# Patient Record
Sex: Female | Born: 1985 | Race: Black or African American | Hispanic: No | Marital: Single | Smoking: Never smoker
Health system: Southern US, Community
[De-identification: ages and names within clinical notes are randomized; demographics above are authoritative.]

## PROBLEM LIST (undated history)

## (undated) ENCOUNTER — Inpatient Hospital Stay (HOSPITAL_COMMUNITY): Payer: Self-pay

## (undated) DIAGNOSIS — O09299 Supervision of pregnancy with other poor reproductive or obstetric history, unspecified trimester: Secondary | ICD-10-CM

## (undated) DIAGNOSIS — Z8619 Personal history of other infectious and parasitic diseases: Secondary | ICD-10-CM

## (undated) DIAGNOSIS — I10 Essential (primary) hypertension: Secondary | ICD-10-CM

## (undated) HISTORY — PX: COLPOSCOPY: SHX161

## (undated) HISTORY — DX: Supervision of pregnancy with other poor reproductive or obstetric history, unspecified trimester: O09.299

## (undated) HISTORY — DX: Essential (primary) hypertension: I10

## (undated) HISTORY — PX: WISDOM TOOTH EXTRACTION: SHX21

## (undated) HISTORY — DX: Personal history of other infectious and parasitic diseases: Z86.19

---

## 2007-06-25 DIAGNOSIS — O1490 Unspecified pre-eclampsia, unspecified trimester: Secondary | ICD-10-CM

## 2007-09-17 ENCOUNTER — Inpatient Hospital Stay (HOSPITAL_COMMUNITY): Admission: AD | Admit: 2007-09-17 | Discharge: 2007-09-18 | Payer: Self-pay | Admitting: Obstetrics & Gynecology

## 2007-10-07 ENCOUNTER — Ambulatory Visit (HOSPITAL_COMMUNITY): Admission: RE | Admit: 2007-10-07 | Discharge: 2007-10-07 | Payer: Self-pay | Admitting: Family Medicine

## 2007-11-04 ENCOUNTER — Ambulatory Visit (HOSPITAL_COMMUNITY): Admission: RE | Admit: 2007-11-04 | Discharge: 2007-11-04 | Payer: Self-pay | Admitting: Obstetrics

## 2007-11-18 ENCOUNTER — Inpatient Hospital Stay (HOSPITAL_COMMUNITY): Admission: AD | Admit: 2007-11-18 | Discharge: 2007-11-18 | Payer: Self-pay | Admitting: Obstetrics

## 2007-12-25 ENCOUNTER — Inpatient Hospital Stay (HOSPITAL_COMMUNITY): Admission: AD | Admit: 2007-12-25 | Discharge: 2007-12-25 | Payer: Self-pay | Admitting: Obstetrics

## 2007-12-27 ENCOUNTER — Inpatient Hospital Stay (HOSPITAL_COMMUNITY): Admission: AD | Admit: 2007-12-27 | Discharge: 2007-12-27 | Payer: Self-pay | Admitting: Obstetrics

## 2008-03-10 ENCOUNTER — Inpatient Hospital Stay (HOSPITAL_COMMUNITY): Admission: AD | Admit: 2008-03-10 | Discharge: 2008-03-10 | Payer: Self-pay | Admitting: Obstetrics

## 2008-03-13 ENCOUNTER — Encounter (INDEPENDENT_AMBULATORY_CARE_PROVIDER_SITE_OTHER): Payer: Self-pay | Admitting: Obstetrics

## 2008-03-13 ENCOUNTER — Inpatient Hospital Stay (HOSPITAL_COMMUNITY): Admission: AD | Admit: 2008-03-13 | Discharge: 2008-03-16 | Payer: Self-pay | Admitting: Obstetrics

## 2008-05-24 ENCOUNTER — Ambulatory Visit (HOSPITAL_COMMUNITY): Admission: RE | Admit: 2008-05-24 | Discharge: 2008-05-24 | Payer: Self-pay | Admitting: Obstetrics

## 2008-05-24 ENCOUNTER — Encounter (INDEPENDENT_AMBULATORY_CARE_PROVIDER_SITE_OTHER): Payer: Self-pay | Admitting: Obstetrics

## 2009-06-20 ENCOUNTER — Emergency Department (HOSPITAL_COMMUNITY): Admission: EM | Admit: 2009-06-20 | Discharge: 2009-06-20 | Payer: Self-pay | Admitting: Emergency Medicine

## 2009-06-21 ENCOUNTER — Emergency Department (HOSPITAL_COMMUNITY): Admission: EM | Admit: 2009-06-21 | Discharge: 2009-06-21 | Payer: Self-pay | Admitting: Family Medicine

## 2010-02-04 ENCOUNTER — Emergency Department (HOSPITAL_COMMUNITY): Admission: EM | Admit: 2010-02-04 | Discharge: 2010-02-04 | Payer: Self-pay | Admitting: Family Medicine

## 2010-02-15 ENCOUNTER — Emergency Department (HOSPITAL_COMMUNITY): Admission: EM | Admit: 2010-02-15 | Discharge: 2010-02-15 | Payer: Self-pay | Admitting: Family Medicine

## 2010-07-03 ENCOUNTER — Emergency Department (HOSPITAL_COMMUNITY)
Admission: EM | Admit: 2010-07-03 | Discharge: 2010-07-03 | Payer: Self-pay | Source: Home / Self Care | Admitting: Family Medicine

## 2010-09-07 LAB — POCT PREGNANCY, URINE

## 2010-09-24 LAB — CBC
MCHC: 34.5 g/dL (ref 30.0–36.0)
MCV: 89.7 fL (ref 78.0–100.0)
Platelets: 189 10*3/uL (ref 150–400)

## 2010-09-24 LAB — BASIC METABOLIC PANEL
CO2: 25 mEq/L (ref 19–32)
Calcium: 8.6 mg/dL (ref 8.4–10.5)
Creatinine, Ser: 0.71 mg/dL (ref 0.4–1.2)
GFR calc non Af Amer: >60 mL/min (ref >60)
Glucose, Bld: 104 mg/dL — ABNORMAL HIGH (ref 70–99)

## 2010-09-24 LAB — D-DIMER, QUANTITATIVE

## 2010-10-24 ENCOUNTER — Emergency Department (HOSPITAL_COMMUNITY)
Admission: EM | Admit: 2010-10-24 | Discharge: 2010-10-25 | Disposition: A | Discharge status: Home / Self Care | Payer: Medicaid Other | Attending: Emergency Medicine | Admitting: Emergency Medicine

## 2010-10-24 DIAGNOSIS — R1310 Dysphagia, unspecified: Secondary | ICD-10-CM | POA: Insufficient documentation

## 2010-10-24 DIAGNOSIS — R6883 Chills (without fever): Secondary | ICD-10-CM | POA: Insufficient documentation

## 2010-10-24 DIAGNOSIS — J029 Acute pharyngitis, unspecified: Secondary | ICD-10-CM | POA: Insufficient documentation

## 2010-10-24 DIAGNOSIS — B9789 Other viral agents as the cause of diseases classified elsewhere: Secondary | ICD-10-CM | POA: Insufficient documentation

## 2010-10-25 LAB — RAPID STREP SCREEN (MED CTR MEBANE ONLY): Streptococcus, Group A Screen (Direct): NEGATIVE

## 2010-11-06 NOTE — H&P (Signed)
NAMESABIRIN, BARAY              ACCOUNT NO.:  [id number]   MEDICAL RECORD NO.:  [id number]          PATIENT TYPE:  INP   LOCATION:  9198                          FACILITY:  [facility]   PHYSICIAN:  Kathreen Cosier, M.D.DATE OF BIRTH:  1985-07-13   DATE OF ADMISSION:  03/13/2008  DATE OF DISCHARGE:                              HISTORY & PHYSICAL   The patient is a 25 year old, primigravida, [facility] April 04, 2008, with  the history of epigastric pain.  She was seen in the emergency room 3  days prior to admission with normal vital signs, and she got a GI  cocktail and felt better and was discharged.  Tonight, the pain was  worse and came in with a diastolic blood pressure 90-112, systolic 135-  146.   Platelets 44, hemoglobin 12.6, SGOT 249, SGPT 329, and uric acid 5.4.  Cervix was long and closed.  She was started on magnesium sulfate 4 g  loading 2 g an hour and should be delivered by C-section for HELLP  syndrome.   PHYSICAL EXAMINATION:  GENERAL:  Regular, well-developed female,  complaining of epigastric pain.  BREAST:  Negative.  LUNGS:  Clear.  HEART:  Regular rhythm.  No murmurs.  No gallops.  ABDOMEN:  A 36-week size.   Fetal heart rate 140 and length were negative.           ______________________________  Kathreen Cosier, M.D.     BAM/MEDQ  D:  03/13/2008  T:  03/13/2008  Job:  [id number]

## 2010-11-06 NOTE — Op Note (Signed)
[name], [name]              ACCOUNT NO.:  [id number]   MEDICAL RECORD NO.:  [id number]          PATIENT TYPE:  AMB   LOCATION:  SDC                           [facility]  [facility]   PHYSICIAN:  Kathreen Cosier, M.D.DATE OF BIRTH:  1985/07/27   DATE OF PROCEDURE:  05/24/2008  DATE OF DISCHARGE:                               OPERATIVE REPORT   PREOPERATIVE DIAGNOSIS:  Severe dysplasia of the cervix.   POSTOPERATIVE DIAGNOSIS:  Severe dysplasia of the cervix.   PROCEDURE:  Cold knife conization of the cervix under general  anesthesia.  The patient in lithotomy position.  Perineum and vagina are  prepped and draped.  Bladder emptied with a straight catheter.  A  weighted speculum placed in the vagina.  The cervix was grasped with  Allis clamp and hemostatic suture placed at 3 o'clock and 9 o'clock on  the lateral aspect of the cervix.  Cold knife cone was done in the usual  manner and hemostasis was achieved with use of sutures of #1 chromic  around the cervix.  The cervical canal was sounded and easily admitted  the sound.  Hemostasis was satisfactory.  The patient was taken recovery  room in good condition.           ______________________________  Kathreen Cosier, M.D.     BAM/MEDQ  D:  05/24/2008  T:  05/25/2008  Job:  [id number]

## 2010-11-06 NOTE — Op Note (Signed)
NAMESHARY, LAMOS              ACCOUNT NO.:  [id number]   MEDICAL RECORD NO.:  [id number]          PATIENT TYPE:  INP   LOCATION:  9198                          [facility]   PHYSICIAN:  Kathreen Cosier, M.D.DATE OF BIRTH:  Jul 15, 1985   DATE OF PROCEDURE:  03/13/2008  DATE OF DISCHARGE:                               OPERATIVE REPORT   PREOPERATIVE DIAGNOSIS:  35-week pregnant with hemolysis, elevated liver  enzymes, low platelets syndrome.   POSTOPERATIVE DIAGNOSIS:  35-week pregnant with hemolysis, elevated  liver enzymes, low platelets syndrome.   ANESTHESIA:  General.   PROCEDURE:  The patient placed on the operating table in the supine  position.  Abdomen prepped and draped, bladder emptied with Foley  catheter.  General anesthesia was then administered.  Transverse  suprapubic incision made and carried down to the rectus fascia.  Fascia  cleaned and incised to length of the incision.  Recti muscles were  retracted laterally.  Peritoneum incised longitudinally.  Transverse  incision made in the visceral peritoneum above the bladder.  Bladder  mobilized inferiorly.  Transverse lower uterine incision made.  Fluid  was clear.  The patient delivered from the LOA position of a female Apgar  8/9, weighing 3 pounds 7.7 ounces.  The team was in attendance.  The  placenta was posterior removed manually and sent to pathology.  Uterine  cavity cleaned with dry laps.  The uterine incision closed in 1 layer  with continuous suture of #1 chromic.  Hemostasis was satisfactory.  Bladder flap reattached with 2-0 chromic.  Uterus well contracted.  Tubes and ovaries normal.  Abdomen closed in layers.  Peritoneum  continuous suture of 0 chromic, fascia continuous suture with Dexon. The  skin closed with staples.  Blood loss was 400 mL.  There was no abnormal  bleeding noted.           ______________________________  Kathreen Cosier, M.D.     BAM/MEDQ  D:  03/13/2008  T:   03/13/2008  Job:  [id number]

## 2010-11-09 NOTE — Discharge Summary (Signed)
Karina Torres, Karina Torres              ACCOUNT NO.:  [id number]   MEDICAL RECORD NO.:  [id number]          PATIENT TYPE:  INP   LOCATION:  9315                          [facility]   PHYSICIAN:  Kathreen Cosier, M.D.DATE OF BIRTH:  1986/04/26   DATE OF ADMISSION:  03/13/2008  DATE OF DISCHARGE:  03/16/2008                               DISCHARGE SUMMARY   The patient is a 25 year old gravida 1, EDC April 04, 2008.  She has a  3-day history of epigastric pain, was seen in the emergency room 3 days  prior to admission with normal vital signs, got a GI cocktail and  finally was discharged.  She was now admitted with worse pain on the  20th, diastolic blood pressures 90-112, systolics 135-146, platelets 44,  hemoglobin 12.6, SGOT 249, SGPT 329, uric acid 54, and started on  magnesium sulfate.  The cervix was closed.  She was 35 weeks' pregnant,  and she had HELLP syndrome.  She was delivered by C-section, had a female,  Apgar 8 and 9, weighing 3 pounds 7.7 ounces.  Postop, she did well.  On  postop #1, platelets were down to 21, SGOT down to 215, SGPT 336, LDH  449.  Her blood pressures were normal.  Her Foley catheter and magnesium  was discontinued.  By day 2, platelets up to 60,000 and day 3, blood  pressures were all normal with diastolics of 86.  She was on labetalol  200 p.o. b.i.d., and she was discharged on the third postoperative day  ambulatory on a regular diet on ferrous sulfate, Tylox, labetalol 200  p.o. b.i.d. to see me in 1 week for blood pressure check.   DISCHARGE DIAGNOSIS:  Status post primary low transverse cesarean  section at 35-36 weeks because of hemolysis, elevated liver enzymes, and  low platelet syndrome.           ______________________________  Kathreen Cosier, M.D.     BAM/MEDQ  D:  04/13/2008  T:  04/13/2008  Job:  [id number]

## 2010-12-29 ENCOUNTER — Ambulatory Visit (INDEPENDENT_AMBULATORY_CARE_PROVIDER_SITE_OTHER): Payer: Medicaid Other

## 2010-12-29 ENCOUNTER — Encounter (HOSPITAL_COMMUNITY): Payer: Self-pay

## 2010-12-29 ENCOUNTER — Inpatient Hospital Stay (INDEPENDENT_AMBULATORY_CARE_PROVIDER_SITE_OTHER)
Admission: RE | Admit: 2010-12-29 | Discharge: 2010-12-29 | Disposition: A | Discharge status: Home / Self Care | Payer: Medicaid Other | Source: Ambulatory Visit | Attending: Family Medicine | Admitting: Family Medicine

## 2010-12-29 DIAGNOSIS — T07XXXA Unspecified multiple injuries, initial encounter: Secondary | ICD-10-CM

## 2011-01-12 ENCOUNTER — Inpatient Hospital Stay (INDEPENDENT_AMBULATORY_CARE_PROVIDER_SITE_OTHER)
Admission: RE | Admit: 2011-01-12 | Discharge: 2011-01-12 | Disposition: A | Discharge status: Home / Self Care | Payer: Medicaid Other | Source: Ambulatory Visit | Attending: Family Medicine | Admitting: Family Medicine

## 2011-01-12 DIAGNOSIS — M62838 Other muscle spasm: Secondary | ICD-10-CM

## 2011-01-12 DIAGNOSIS — M799 Soft tissue disorder, unspecified: Secondary | ICD-10-CM

## 2011-01-12 DIAGNOSIS — M549 Dorsalgia, unspecified: Secondary | ICD-10-CM

## 2011-02-10 ENCOUNTER — Emergency Department (HOSPITAL_COMMUNITY)
Admission: EM | Admit: 2011-02-10 | Discharge: 2011-02-10 | Disposition: A | Discharge status: Home / Self Care | Payer: No Typology Code available for payment source | Attending: Emergency Medicine | Admitting: Emergency Medicine

## 2011-02-10 DIAGNOSIS — M545 Low back pain, unspecified: Secondary | ICD-10-CM | POA: Insufficient documentation

## 2011-02-10 DIAGNOSIS — M549 Dorsalgia, unspecified: Secondary | ICD-10-CM | POA: Insufficient documentation

## 2011-03-18 LAB — URINALYSIS, ROUTINE W REFLEX MICROSCOPIC
Bilirubin Urine: NEGATIVE
Glucose, UA: NEGATIVE
Hgb urine dipstick: NEGATIVE
Ketones, ur: NEGATIVE
Protein, ur: NEGATIVE

## 2011-03-20 LAB — URINALYSIS, ROUTINE W REFLEX MICROSCOPIC
Bilirubin Urine: NEGATIVE
Glucose, UA: NEGATIVE
Hgb urine dipstick: NEGATIVE
Ketones, ur: NEGATIVE
pH: 7

## 2011-03-25 LAB — ABO/RH: ABO/RH(D): A POS

## 2011-03-25 LAB — CROSSMATCH
ABO/RH(D): A POS
Antibody Screen: NEGATIVE

## 2011-03-25 LAB — COMPREHENSIVE METABOLIC PANEL
ALT: 336 — ABNORMAL HIGH
AST: 215 — ABNORMAL HIGH
AST: 58 — ABNORMAL HIGH
Albumin: 1.8 — ABNORMAL LOW
Albumin: 2.5 — ABNORMAL LOW
Alkaline Phosphatase: 86
BUN: 5 — ABNORMAL LOW
CO2: 30
Calcium: 8.7
Chloride: 102
Chloride: 102
Chloride: 98
Creatinine, Ser: 0.59
Creatinine, Ser: 0.59
GFR calc Af Amer: >60
GFR calc Af Amer: >60
GFR calc non Af Amer: >60
GFR calc non Af Amer: >60
Glucose, Bld: 85
Potassium: 3.7
Sodium: 134 — ABNORMAL LOW
Total Bilirubin: 0.5
Total Bilirubin: 0.6
Total Bilirubin: 0.8
Total Protein: 4.9 — ABNORMAL LOW

## 2011-03-25 LAB — URIC ACID: Uric Acid, Serum: 5.4

## 2011-03-25 LAB — CBC
HCT: 25.2 — ABNORMAL LOW
HCT: 35.8 — ABNORMAL LOW
Hemoglobin: 12.3
Hemoglobin: 8.6 — ABNORMAL LOW
MCHC: 34.4
MCV: 91.4
MCV: 91.8
Platelets: 21 — CL
RBC: 2.75 — ABNORMAL LOW
RDW: 13.9
RDW: 14
WBC: 14.6 — ABNORMAL HIGH
WBC: 15.2 — ABNORMAL HIGH

## 2011-03-25 LAB — LACTATE DEHYDROGENASE: LDH: 308 — ABNORMAL HIGH

## 2011-03-25 LAB — MAGNESIUM: Magnesium: 5.9 — ABNORMAL HIGH

## 2011-03-29 LAB — CBC
Hemoglobin: 12.5 g/dL (ref 12.0–15.0)
Platelets: 311 10*3/uL (ref 150–400)
RDW: 12.5 % (ref 11.5–15.5)
WBC: 6.3 10*3/uL (ref 4.0–10.5)
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
## 2014-06-24 NOTE — L&D Delivery Note (Signed)
Delivery Note At 3:57 PM a viable female was delivered via VBAC, Spontaneous (Presentation: ;  ).  APGAR: , 9; weight  .   Placenta status: Intact, Spontaneous.  Cord:  with the following complications: .  Cord pH: not done  Anesthesia: Epidural  Episiotomy: Median Lacerations: 3rd degree Suture Repair: 2.0 vicryl Est. Blood Loss (mL): 250  Mom to postpartum.  Baby to Couplet care / Skin to Skin.  Sylus Stgermain A 05/22/2015, 4:33 PM

## 2014-10-19 LAB — OB RESULTS CONSOLE ANTIBODY SCREEN: Antibody Screen: NEGATIVE

## 2014-10-19 LAB — OB RESULTS CONSOLE ABO/RH: RH TYPE: POSITIVE

## 2014-10-19 LAB — OB RESULTS CONSOLE HIV ANTIBODY (ROUTINE TESTING): HIV: NONREACTIVE

## 2014-10-19 LAB — OB RESULTS CONSOLE GC/CHLAMYDIA
Chlamydia: NEGATIVE
GC PROBE AMP, GENITAL: NEGATIVE

## 2014-10-19 LAB — OB RESULTS CONSOLE RUBELLA ANTIBODY, IGM: Rubella: IMMUNE

## 2014-10-19 LAB — OB RESULTS CONSOLE HEPATITIS B SURFACE ANTIGEN: HEP B S AG: NEGATIVE

## 2014-10-19 LAB — OB RESULTS CONSOLE RPR: RPR: NONREACTIVE

## 2014-12-10 ENCOUNTER — Inpatient Hospital Stay (HOSPITAL_COMMUNITY)
Admission: AD | Admit: 2014-12-10 | Discharge: 2014-12-10 | Disposition: A | Discharge status: Home / Self Care | Payer: Medicaid Other | Source: Ambulatory Visit | Attending: Obstetrics | Admitting: Obstetrics

## 2014-12-10 ENCOUNTER — Encounter (HOSPITAL_COMMUNITY): Payer: Self-pay | Admitting: *Deleted

## 2014-12-10 DIAGNOSIS — Z3A17 17 weeks gestation of pregnancy: Secondary | ICD-10-CM | POA: Insufficient documentation

## 2014-12-10 DIAGNOSIS — O26892 Other specified pregnancy related conditions, second trimester: Secondary | ICD-10-CM

## 2014-12-10 DIAGNOSIS — R51 Headache: Secondary | ICD-10-CM | POA: Diagnosis present

## 2014-12-10 DIAGNOSIS — R519 Headache, unspecified: Secondary | ICD-10-CM

## 2014-12-10 DIAGNOSIS — O9989 Other specified diseases and conditions complicating pregnancy, childbirth and the puerperium: Secondary | ICD-10-CM | POA: Insufficient documentation

## 2014-12-10 LAB — URINALYSIS, ROUTINE W REFLEX MICROSCOPIC
Bilirubin Urine: NEGATIVE
Glucose, UA: NEGATIVE mg/dL
Hgb urine dipstick: NEGATIVE
Ketones, ur: NEGATIVE mg/dL
Leukocytes, UA: NEGATIVE
NITRITE: NEGATIVE
PH: 7 (ref 5.0–8.0)
PROTEIN: NEGATIVE mg/dL
SPECIFIC GRAVITY, URINE: 1.015 (ref 1.005–1.030)
UROBILINOGEN UA: 0.2 mg/dL (ref 0.0–1.0)

## 2014-12-10 MED ORDER — METOCLOPRAMIDE HCL 10 MG PO TABS: 10.0000 mg | ORAL_TABLET | Freq: Four times a day (QID) | ORAL | Status: DC

## 2014-12-10 MED ORDER — METOCLOPRAMIDE HCL 10 MG PO TABS
10.0000 mg | ORAL_TABLET | Freq: Once | ORAL | Status: AC
  Administered 2014-12-10: 10 mg via ORAL
  Filled 2014-12-10: qty 1

## 2014-12-10 MED ORDER — PROMETHAZINE HCL 25 MG PO TABS
25.0000 mg | ORAL_TABLET | Freq: Once | ORAL | Status: AC
  Administered 2014-12-10: 25 mg via ORAL
  Filled 2014-12-10: qty 1

## 2014-12-10 MED ORDER — PROMETHAZINE HCL 25 MG PO TABS: 25.0000 mg | ORAL_TABLET | Freq: Four times a day (QID) | ORAL | Status: DC | PRN

## 2014-12-10 NOTE — MAU Provider Note (Signed)
History     CSN: [id number]  Arrival date and time: 12/10/14 1213   First Provider Initiated Contact with Patient 12/10/14 1307      Chief Complaint  Patient presents with  . Emesis  . Headache  . Abdominal Cramping   HPI 29 y.o. G2P0 at [redacted]w[redacted]d with headache since yesterday, + light sensitivity, one episode of vomiting this morning. Pt states she took some Excedrin for tension headache this morning around 2 AM with minimal relief, states this usually works for her headaches. Also having some intermittent, infrequent cramping. No bleeding or LOF. Pt states normal prenatal course so far.   History reviewed. No pertinent past medical history.  Past Surgical History  Procedure Laterality Date  . Cesarean section      History reviewed. No pertinent family history.  History  Substance Use Topics  . Smoking status: Never Smoker   . Smokeless tobacco: Not on file  . Alcohol Use: No    Allergies: No Known Allergies  No prescriptions prior to admission    Review of Systems  Constitutional: Negative.  Negative for fever and chills.  Eyes: Positive for photophobia. Negative for blurred vision.  Respiratory: Negative.  Negative for cough.   Cardiovascular: Negative.  Negative for chest pain.  Gastrointestinal: Positive for nausea and vomiting. Negative for abdominal pain, diarrhea and constipation.  Genitourinary: Negative for dysuria, urgency, frequency, hematuria and flank pain.       Negative for vaginal bleeding, vaginal discharge  Musculoskeletal: Negative.   Neurological: Positive for headaches.  Psychiatric/Behavioral: Negative.    Physical Exam   Blood pressure 106/66, pulse 77, temperature 98.3 F (36.8 C), resp. rate 18, height 5' (1.524 m), weight 140 lb 9.6 oz (63.776 kg), last menstrual period 08/09/2014.  Physical Exam  Nursing note and vitals reviewed. Constitutional: She is oriented to person, place, and time. She appears well-developed and well-nourished.  No distress.  Cardiovascular: Normal rate.   Respiratory: Effort normal.  Musculoskeletal: Normal range of motion.  Neurological: She is alert and oriented to person, place, and time.  Skin: Skin is warm and dry.  Psychiatric: She has a normal mood and affect.   + FHR  MAU Course  Procedures  Results for orders placed or performed during the hospital encounter of 12/10/14 (from the past 24 hour(s))  Urinalysis, Routine w reflex microscopic (not at [facility])     Status: None   Collection Time: 12/10/14 12:35 PM  Result Value Ref Range   Color, Urine YELLOW YELLOW   APPearance CLEAR CLEAR   Specific Gravity, Urine 1.015 1.005 - 1.030   pH 7.0 5.0 - 8.0   Glucose, UA NEGATIVE NEGATIVE mg/dL   Hgb urine dipstick NEGATIVE NEGATIVE   Bilirubin Urine NEGATIVE NEGATIVE   Ketones, ur NEGATIVE NEGATIVE mg/dL   Protein, ur NEGATIVE NEGATIVE mg/dL   Urobilinogen, UA 0.2 0.0 - 1.0 mg/dL   Nitrite NEGATIVE NEGATIVE   Leukocytes, UA NEGATIVE NEGATIVE   Reglan 10 mg and Phenergan 25 mg PO, pt feeling much better following  Assessment and Plan   1. Headache in pregnancy, antepartum, second trimester   Rx Reglan and Phenergan, follow up as scheduled or sooner PRN    Medication List    TAKE these medications        metoCLOPramide 10 MG tablet  Commonly known as:  REGLAN  Take 1 tablet (10 mg total) by mouth 4 (four) times daily.     promethazine 25 MG tablet  Commonly known as:  PHENERGAN  Take 1 tablet (25 mg total) by mouth every 6 (six) hours as needed for nausea or vomiting.        Follow-up Information    Follow up with Kathreen Cosier, MD.   Specialty:  Obstetrics and Gynecology   Why:  as scheduled or sooner as needed   Contact information:   [address] [place] [address] [phone]         [facility] 12/10/2014, 3:16 PM

## 2014-12-10 NOTE — MAU Note (Signed)
Pt presents to MAU with complaints of headache and lower abdominal cramping since yesterday. Vomiting this morning and noticed red patches on her face. Denies any vaginal bleeding or discharge

## 2015-02-21 LAB — PROCEDURE REPORT - SCANNED: PAP SMEAR: NEGATIVE

## 2015-05-16 ENCOUNTER — Telehealth (HOSPITAL_COMMUNITY): Payer: Self-pay | Admitting: *Deleted

## 2015-05-16 ENCOUNTER — Other Ambulatory Visit: Payer: Self-pay | Admitting: Obstetrics

## 2015-05-16 ENCOUNTER — Encounter (HOSPITAL_COMMUNITY): Payer: Self-pay | Admitting: *Deleted

## 2015-05-16 LAB — OB RESULTS CONSOLE GBS: GBS: NEGATIVE

## 2015-05-16 NOTE — Telephone Encounter (Signed)
Preadmission screen  

## 2015-05-22 ENCOUNTER — Inpatient Hospital Stay (HOSPITAL_COMMUNITY): Payer: Medicaid Other | Admitting: Anesthesiology

## 2015-05-22 ENCOUNTER — Inpatient Hospital Stay (HOSPITAL_COMMUNITY)
Admission: RE | Admit: 2015-05-22 | Discharge: 2015-05-24 | DRG: 775 | Disposition: A | Discharge status: Home / Self Care | Payer: Medicaid Other | Source: Ambulatory Visit | Attending: Obstetrics | Admitting: Obstetrics

## 2015-05-22 ENCOUNTER — Encounter (HOSPITAL_COMMUNITY): Payer: Self-pay

## 2015-05-22 DIAGNOSIS — Z3A40 40 weeks gestation of pregnancy: Secondary | ICD-10-CM | POA: Diagnosis not present

## 2015-05-22 DIAGNOSIS — O34211 Maternal care for low transverse scar from previous cesarean delivery: Secondary | ICD-10-CM | POA: Diagnosis present

## 2015-05-22 LAB — CBC
HEMATOCRIT: 33.9 % — AB (ref 36.0–46.0)
Hemoglobin: 11.1 g/dL — ABNORMAL LOW (ref 12.0–15.0)
MCH: 29.7 pg (ref 26.0–34.0)
MCHC: 32.7 g/dL (ref 30.0–36.0)
MCV: 90.6 fL (ref 78.0–100.0)
Platelets: 220 10*3/uL (ref 150–400)
RBC: 3.74 MIL/uL — AB (ref 3.87–5.11)
RDW: 13.4 % (ref 11.5–15.5)
WBC: 10.7 10*3/uL — AB (ref 4.0–10.5)

## 2015-05-22 LAB — RPR: RPR Ser Ql: NONREACTIVE

## 2015-05-22 LAB — TYPE AND SCREEN
ABO/RH(D): A POS
ANTIBODY SCREEN: NEGATIVE

## 2015-05-22 MED ORDER — ZOLPIDEM TARTRATE 5 MG PO TABS
5.0000 mg | ORAL_TABLET | Freq: Every evening | ORAL | Status: DC | PRN
Start: 2015-05-22 — End: 2015-05-24

## 2015-05-22 MED ORDER — EPHEDRINE 5 MG/ML INJ
10.0000 mg | INTRAVENOUS | Status: DC | PRN
  Filled 2015-05-22: qty 2

## 2015-05-22 MED ORDER — ACETAMINOPHEN 325 MG PO TABS: 650.0000 mg | ORAL_TABLET | ORAL | Status: DC | PRN

## 2015-05-22 MED ORDER — LANOLIN HYDROUS EX OINT: TOPICAL_OINTMENT | CUTANEOUS | Status: DC | PRN

## 2015-05-22 MED ORDER — ONDANSETRON HCL 4 MG PO TABS: 4.0000 mg | ORAL_TABLET | ORAL | Status: DC | PRN

## 2015-05-22 MED ORDER — OXYTOCIN 40 UNITS IN LACTATED RINGERS INFUSION - SIMPLE MED
62.5000 mL/h | INTRAVENOUS | Status: DC
  Administered 2015-05-22: 62.5 mL/h via INTRAVENOUS

## 2015-05-22 MED ORDER — SIMETHICONE 80 MG PO CHEW: 80.0000 mg | CHEWABLE_TABLET | ORAL | Status: DC | PRN

## 2015-05-22 MED ORDER — WITCH HAZEL-GLYCERIN EX PADS: 1.0000 "application " | MEDICATED_PAD | CUTANEOUS | Status: DC | PRN

## 2015-05-22 MED ORDER — OXYTOCIN 40 UNITS IN LACTATED RINGERS INFUSION - SIMPLE MED: 1.0000 m[IU]/min | INTRAVENOUS | Status: DC

## 2015-05-22 MED ORDER — SENNOSIDES-DOCUSATE SODIUM 8.6-50 MG PO TABS
2.0000 | ORAL_TABLET | ORAL | Status: DC
  Administered 2015-05-23 (×2): 2 via ORAL
  Filled 2015-05-22 (×2): qty 2

## 2015-05-22 MED ORDER — FERROUS SULFATE 325 (65 FE) MG PO TABS
325.0000 mg | ORAL_TABLET | Freq: Two times a day (BID) | ORAL | Status: DC
  Administered 2015-05-23 (×2): 325 mg via ORAL
  Filled 2015-05-22 (×2): qty 1

## 2015-05-22 MED ORDER — ONDANSETRON HCL 4 MG/2ML IJ SOLN: 4.0000 mg | INTRAMUSCULAR | Status: DC | PRN

## 2015-05-22 MED ORDER — TETANUS-DIPHTH-ACELL PERTUSSIS 5-2.5-18.5 LF-MCG/0.5 IM SUSP: 0.5000 mL | Freq: Once | INTRAMUSCULAR | Status: DC

## 2015-05-22 MED ORDER — LACTATED RINGERS IV SOLN: 500.0000 mL | INTRAVENOUS | Status: DC | PRN

## 2015-05-22 MED ORDER — LACTATED RINGERS IV SOLN
INTRAVENOUS | Status: DC
  Administered 2015-05-22: 03:00:00 via INTRAVENOUS

## 2015-05-22 MED ORDER — DIPHENHYDRAMINE HCL 50 MG/ML IJ SOLN: 12.5000 mg | INTRAMUSCULAR | Status: DC | PRN

## 2015-05-22 MED ORDER — OXYTOCIN BOLUS FROM INFUSION: 500.0000 mL | INTRAVENOUS | Status: DC

## 2015-05-22 MED ORDER — BENZOCAINE-MENTHOL 20-0.5 % EX AERO
1.0000 "application " | INHALATION_SPRAY | CUTANEOUS | Status: DC | PRN
  Administered 2015-05-22: 1 via TOPICAL
  Filled 2015-05-22: qty 56

## 2015-05-22 MED ORDER — PRENATAL MULTIVITAMIN CH
1.0000 | ORAL_TABLET | Freq: Every day | ORAL | Status: DC
  Administered 2015-05-23: 1 via ORAL
  Filled 2015-05-22: qty 1

## 2015-05-22 MED ORDER — CITRIC ACID-SODIUM CITRATE 334-500 MG/5ML PO SOLN: 30.0000 mL | ORAL | Status: DC | PRN

## 2015-05-22 MED ORDER — OXYCODONE-ACETAMINOPHEN 5-325 MG PO TABS: 1.0000 | ORAL_TABLET | ORAL | Status: DC | PRN

## 2015-05-22 MED ORDER — FLEET ENEMA 7-19 GM/118ML RE ENEM: 1.0000 | ENEMA | RECTAL | Status: DC | PRN

## 2015-05-22 MED ORDER — DIBUCAINE 1 % RE OINT: 1.0000 "application " | TOPICAL_OINTMENT | RECTAL | Status: DC | PRN

## 2015-05-22 MED ORDER — LIDOCAINE HCL (PF) 1 % IJ SOLN
30.0000 mL | INTRAMUSCULAR | Status: DC | PRN
  Administered 2015-05-22: 30 mL via SUBCUTANEOUS
  Filled 2015-05-22: qty 30

## 2015-05-22 MED ORDER — ONDANSETRON HCL 4 MG/2ML IJ SOLN: 4.0000 mg | Freq: Four times a day (QID) | INTRAMUSCULAR | Status: DC | PRN

## 2015-05-22 MED ORDER — PHENYLEPHRINE 40 MCG/ML (10ML) SYRINGE FOR IV PUSH (FOR BLOOD PRESSURE SUPPORT)
80.0000 ug | PREFILLED_SYRINGE | INTRAVENOUS | Status: DC | PRN
  Filled 2015-05-22: qty 20
  Filled 2015-05-22: qty 2

## 2015-05-22 MED ORDER — LIDOCAINE HCL (PF) 1 % IJ SOLN
INTRAMUSCULAR | Status: DC | PRN
  Administered 2015-05-22 (×2): 8 mL via EPIDURAL

## 2015-05-22 MED ORDER — IBUPROFEN 600 MG PO TABS
600.0000 mg | ORAL_TABLET | Freq: Four times a day (QID) | ORAL | Status: DC
  Administered 2015-05-22 – 2015-05-24 (×8): 600 mg via ORAL
  Filled 2015-05-22 (×8): qty 1

## 2015-05-22 MED ORDER — DIPHENHYDRAMINE HCL 25 MG PO CAPS: 25.0000 mg | ORAL_CAPSULE | Freq: Four times a day (QID) | ORAL | Status: DC | PRN

## 2015-05-22 MED ORDER — TERBUTALINE SULFATE 1 MG/ML IJ SOLN
0.2500 mg | Freq: Once | INTRAMUSCULAR | Status: DC | PRN
  Filled 2015-05-22: qty 1

## 2015-05-22 MED ORDER — FENTANYL 2.5 MCG/ML BUPIVACAINE 1/10 % EPIDURAL INFUSION (WH - ANES)
14.0000 mL/h | INTRAMUSCULAR | Status: DC | PRN
  Administered 2015-05-22 (×2): 14 mL/h via EPIDURAL
  Filled 2015-05-22: qty 125

## 2015-05-22 MED ORDER — OXYTOCIN 40 UNITS IN LACTATED RINGERS INFUSION - SIMPLE MED
1.0000 m[IU]/min | INTRAVENOUS | Status: DC
  Administered 2015-05-22: 1 m[IU]/min via INTRAVENOUS
  Filled 2015-05-22: qty 1000

## 2015-05-22 MED ORDER — FENTANYL CITRATE (PF) 100 MCG/2ML IJ SOLN
50.0000 ug | INTRAMUSCULAR | Status: DC | PRN
  Administered 2015-05-22 (×2): 100 ug via INTRAVENOUS
  Administered 2015-05-22: 50 ug via INTRAVENOUS
  Filled 2015-05-22 (×3): qty 2

## 2015-05-22 MED ORDER — OXYCODONE-ACETAMINOPHEN 5-325 MG PO TABS: 2.0000 | ORAL_TABLET | ORAL | Status: DC | PRN

## 2015-05-22 NOTE — H&P (Signed)
This is Dr. Francoise CeoBernard Damoni Erker dictating the history and physical on  Karina Torres  she's a 29 year old [facility]ida 2 [facility] GBS brought in for induction she had a C-section in the past because it helps syndrome she is now on Pitocin and her cervix is 2 cm 90% vertex 0 station amniotomy performed and the fluids clear IUPC was inserted Past surgical history she had a C-section because of syndrome Past medical history negative her blood pressures and labs were all normal with this pregnancy Social history negative System review negative Family history negative Physical exam well-developed female in labor HEENT negative Lungs clear to P&A Heart regular rhythm no murmurs no gallops Breasts negative Abdomen term Pelvic as described above Extremities negative

## 2015-05-22 NOTE — Anesthesia Preprocedure Evaluation (Signed)
Anesthesia Evaluation  Patient identified by MRN, date of birth, ID band Patient awake    Reviewed: Allergy & Precautions, H&P , NPO status , Patient's Chart, lab work & pertinent test results  Airway Mallampati: II  TM Distance: >3 FB Neck ROM: full    Dental no notable dental hx.    Pulmonary neg pulmonary ROS,    Pulmonary exam normal        Cardiovascular negative cardio ROS Normal cardiovascular exam Rhythm:regular Rate:Normal     Neuro/Psych negative neurological ROS  negative psych ROS   GI/Hepatic negative GI ROS, Neg liver ROS,   Endo/Other  negative endocrine ROS  Renal/GU negative Renal ROS     Musculoskeletal   Abdominal Normal abdominal exam  (+)   Peds  Hematology negative hematology ROS (+)   Anesthesia Other Findings   Reproductive/Obstetrics (+) Pregnancy                             Anesthesia Physical Anesthesia Plan  ASA: II  Anesthesia Plan: Epidural   Post-op Pain Management:    Induction:   Airway Management Planned:   Additional Equipment:   Intra-op Plan:   Post-operative Plan:   Informed Consent: I have reviewed the patients History and Physical, chart, labs and discussed the procedure including the risks, benefits and alternatives for the proposed anesthesia with the patient or authorized representative who has indicated his/her understanding and acceptance.     Plan Discussed with:   Anesthesia Plan Comments:         Anesthesia Quick Evaluation

## 2015-05-22 NOTE — Anesthesia Procedure Notes (Signed)
Epidural Patient location during procedure: OB Start time: 05/22/2015 10:49 AM End time: 05/22/2015 10:53 AM  Staffing Anesthesiologist: Leilani AbleHATCHETT, Khailee Mick Performed by: anesthesiologist   Preanesthetic Checklist Completed: patient identified, surgical consent, pre-op evaluation, timeout performed, IV checked, risks and benefits discussed and monitors and equipment checked  Epidural Patient position: sitting Prep: site prepped and draped and DuraPrep Patient monitoring: continuous pulse ox and blood pressure Approach: midline Injection technique: LOR air  Needle:  Needle type: Tuohy  Needle gauge: 17 G Needle length: 9 cm and 9 Needle insertion depth: 7 cm Catheter type: closed end flexible Catheter size: 19 Gauge Catheter at skin depth: 12 cm Test dose: negative and Other  Assessment Sensory level: T9 Events: blood not aspirated, injection not painful, no injection resistance, negative IV test and no paresthesia  Additional Notes Reason for block:procedure for pain

## 2015-05-23 LAB — CBC
HEMATOCRIT: 28.7 % — AB (ref 36.0–46.0)
HEMOGLOBIN: 9.5 g/dL — AB (ref 12.0–15.0)
MCH: 29.6 pg (ref 26.0–34.0)
MCHC: 33.1 g/dL (ref 30.0–36.0)
MCV: 89.4 fL (ref 78.0–100.0)
Platelets: 178 10*3/uL (ref 150–400)
RBC: 3.21 MIL/uL — AB (ref 3.87–5.11)
RDW: 13.6 % (ref 11.5–15.5)
WBC: 12.7 10*3/uL — AB (ref 4.0–10.5)

## 2015-05-23 NOTE — Progress Notes (Signed)
Patient ID: Karina Torres, female   DOB: 10-Mar-1986, 29 y.o.   MRN: [id number] Postpartum day one Blood pressure 121/68 afebrile pulse 86 respiration 18 Fundus firm Lochia moderate Legs negative doing well

## 2015-05-23 NOTE — Lactation Note (Signed)
This note was copied from the chart of Karina Torres. Lactation Consultation Note  Patient Name: Karina Dorathy Daftiffany Kilmartin WUJWJ'XToday's Date: 05/23/2015 Reason for consult: Initial assessment    With this mom of a term infant, now 7221 hours old. I assisted mom with latching the baby in football hold. The baby latched easily, strong, rhythmic suckles, good breast movement. Basic lactation and breastfeeding teaching done. Mom knows to call for questions/concerns.    Maternal Data Formula Feeding for Exclusion: No Has patient been taught Hand Expression?: Yes Does the patient have breastfeeding experience prior to this delivery?: Yes  Feeding Feeding Type: Breast Fed Length of feed:  (fed for whole consult, at least 20 minutes, still feeding when I left room)  LATCH Score/Interventions Latch: Grasps breast easily, tongue down, lips flanged, rhythmical sucking.  Audible Swallowing: A few with stimulation  Type of Nipple: Everted at rest and after stimulation  Comfort (Breast/Nipple): Soft / non-tender (large, soft, pendulatn breasts)     Hold (Positioning): Assistance needed to correctly position infant at breast and maintain latch. (showed mom how to position herself and baby for football hold) Intervention(s): Breastfeeding basics reviewed;Support Pillows;Position options;Skin to skin  LATCH Score: 8  Lactation Tools Discussed/Used     Consult Status Consult Status: Follow-up Date: 05/24/15 Follow-up type: In-patient    Alfred LevinsLee, Ellarose Brandi Anne 05/23/2015, 1:59 PM
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
## 2015-05-23 NOTE — Anesthesia Postprocedure Evaluation (Signed)
Anesthesia Post Note  Patient: Karina Torres  Procedure(s) Performed: * No procedures listed *  Patient location during evaluation: Mother Baby Anesthesia Type: Epidural Level of consciousness: awake and alert Pain management: pain level controlled Vital Signs Assessment: post-procedure vital signs reviewed and stable Respiratory status: spontaneous breathing Cardiovascular status: blood pressure returned to baseline Postop Assessment: no headache and no backache    Last Vitals:  Filed Vitals:   05/22/15 2248 05/23/15 0625  BP: 117/65 121/68  Pulse: 95 86  Temp: 37.3 C 36.9 C  Resp: 18 18    Last Pain:  Filed Vitals:   05/23/15 0625  PainSc: 0-No pain                 Abrahan Fulmore

## 2015-05-23 NOTE — Plan of Care (Signed)
Problem: Coping: Goal: Ability to cope will improve Outcome: Progressing FOB- questioning paternity- brought own kit

## 2015-05-24 ENCOUNTER — Ambulatory Visit: Payer: Self-pay

## 2015-05-24 NOTE — Lactation Note (Signed)
This note was copied from the chart of Karina Torres. Lactation Consultation Note Has semi flat nipples, large pendulum breast. Has edema to LE. Nipples evert to short shaft after stimulation. Hand pump given to pre-pump to evert nipples more. Encouraged to wear shells between BF in bra, will make a big difference. Mom is sore, slight bruising noted. comfort gels given. Assisted in deeper latch in football hold. Encouraged mom not to pull back on breast tissue d/t causing shallow latching. Discussed positions, obtaining deep latch, newborn behavior, I&O monitoring, supply and demand, and engorgement prevention. Has WIC and plans to f/u w/them.  Patient Name: Karina Dorathy Daftiffany Eilert GNFAO'ZToday's Date: 05/24/2015 Reason for consult: Follow-up assessment;Breast/nipple pain   Maternal Data    Feeding Feeding Type: Breast Fed Length of feed: 20 min  LATCH Score/Interventions Latch: Repeated attempts needed to sustain latch, nipple held in mouth throughout feeding, stimulation needed to elicit sucking reflex. Intervention(s): Adjust position;Assist with latch  Audible Swallowing: A few with stimulation Intervention(s): Skin to skin;Hand expression;Alternate breast massage  Type of Nipple: Flat (semi flat) Intervention(s): Shells;Hand pump;Double electric pump  Comfort (Breast/Nipple): Filling, red/small blisters or bruises, mild/mod discomfort  Problem noted: Mild/Moderate discomfort Interventions (Mild/moderate discomfort): Comfort gels;Pre-pump if needed;Hand massage;Hand expression  Hold (Positioning): Assistance needed to correctly position infant at breast and maintain latch. Intervention(s): Support Pillows;Breastfeeding basics reviewed;Position options;Skin to skin  LATCH Score: 5  Lactation Tools Discussed/Used Tools: Shells;Pump;Comfort gels Shell Type: Inverted Breast pump type: Double-Electric Breast Pump WIC Program: Yes Pump Review: Setup, frequency, and cleaning;Milk  Storage   Consult Status Consult Status: Complete Date: 05/24/15    Charyl DancerCARVER, Angla Delahunt G 05/24/2015, 3:25 AM

## 2015-05-24 NOTE — Lactation Note (Signed)
This note was copied from the chart of Karina Dorathy Daftiffany Ey. Lactation Consultation Note: Mother states that infant is breastfeeding better. She denies having any pain with the latch. She is wearing shells for semiflat nipple tissue. Mother  pumped for 2 weeks with her first child that was in NICU. This is mothers first child to breastfeed. Mother has comfort gels. Mother also has a hand pump. Discussed pre-pumping breast to soften tissue when her breast are filling. Mother has understanding of treatment plan to prevent engorgement. Mother advised to continue to breastfeed infant every 2-3 hours at least 8-12 times in 24 hours. Advised frequent STS. Mother was scheduled for a LC follow up on December 9 at 10:30.   Patient Name: Karina Torres EAVWU'JToday's Date: 05/24/2015     Maternal Data    Feeding    LATCH Score/Interventions                      Lactation Tools Discussed/Used     Consult Status      Karina Torres, Karina Torres 05/24/2015, 2:34 PM

## 2015-05-24 NOTE — Discharge Instructions (Signed)
Discharge instructions   You can wash your hair  Shower  Eat what you want  Drink what you want  See me in 6 weeks  Your ankles are going to swell more in the next 2 weeks than when pregnant  No sex for 6 weeks   Takeo Harts A, MD 05/24/2015

## 2015-05-24 NOTE — Discharge Summary (Signed)
Obstetric Discharge Summary Reason for Admission: induction of labor Prenatal Procedures: none Intrapartum Procedures: spontaneous vaginal delivery Postpartum Procedures: none Complications-Operative and Postpartum: none HEMOGLOBIN  Date Value Ref Range Status  05/23/2015 9.5* 12.0 - 15.0 g/dL Final   HCT  Date Value Ref Range Status  05/23/2015 28.7* 36.0 - 46.0 % Final    Physical Exam:  General: alert Lochia: appropriate Uterine Fundus: firm Incision: healing well DVT Evaluation: No evidence of DVT seen on physical exam.  Discharge Diagnoses: Term Pregnancy-delivered  Discharge Information: Date: 05/24/2015 Activity: pelvic rest Diet: routine Medications: Percocet Condition: improved Instructions: refer to practice specific booklet Discharge to: home Follow-up Information    Follow up with Johaan Ryser A, MD In 6 weeks.   Specialty:  Obstetrics and Gynecology   Contact information:   [address] [place] [address] [phone]       Newborn Data: Live born female  Birth Weight: 6 lb 14 oz (3118 g) APGAR: 9, 9  Home with mother.  Linda Biehn A 05/24/2015, 6:14 AM

## 2015-06-02 ENCOUNTER — Ambulatory Visit (HOSPITAL_COMMUNITY)
Admit: 2015-06-02 | Discharge: 2015-06-02 | Disposition: A | Discharge status: Home / Self Care | Payer: Medicaid Other | Attending: Obstetrics | Admitting: Obstetrics

## 2015-06-02 NOTE — Lactation Note (Signed)
Lactation Consult  Mother's reason for visit:  Assist with feedings Visit Type:  Feeding assessment Appointment Notes:  None Consult:  Initial Lactation Consultant:  Huston Foley  ________________________________________________________________________   Baby's Name: Karina Torres Date of Birth: 05/22/2015 Pediatrician: Roma Schanz Gender: female Gestational Age: [redacted]w[redacted]d (At Birth) Birth Weight: 6 lb 14 oz (3118 g) Weight at Discharge: Weight: 6 lb 7.9 oz (2945 g)Date of Discharge: 05/24/2015 Filed Weights   05/22/15 1646 05/22/15 2300 05/24/15 0000  Weight: 6 lb 14 oz (3118 g) 6 lb 13.4 oz (3100 g) 6 lb 7.9 oz (2945 g)     Weight today: 6-14.9     Admission Information     Provider Service Admission Date    Silvano Rusk, MD Newborn 05/22/2015          ADT Events       Unit Room Bed Service Event    05/22/15 1557 [facility] 9197 [id number] Newborn Admission    05/22/15 1604 [facility] 9197 [id number] Newborn Transfer Out    05/22/15 1604 [facility] 9150 [id number] Newborn Transfer In    05/22/15 1609 [facility] 9150 [id number] Newborn Patient Update    05/24/15 1115 [facility] 9150 [id number] Newborn Discharge      Weight Information (last 3 days) before discharge     Date/Time   Weight   BSA (Calculated - sq m)   BMI (Calculated) Who      05/24/15 0000  6 lb 7.9 oz (2945 g)  --  -- CS     05/22/15 2300  6 lb 13.4 oz (3100 g)  --  -- JG     05/22/15 1646  6 lb 14 oz (3118 g)  0.21 sq meters  12.7 MG     05/22/15 1557  6 lb 14 oz (3118 g)  0.21 sq meters  12.7 [facility]           Weights    Go to now       05/20/15 - Today         One Day Eight Hours  View All    11/28 0700 - 11/29 0659 11/30   Time:  1557 1646 2300 0000    Weight   Weight  6 lb 1... 6 lb 1... 6 lb 1... 6 lb 7...  Weight         ##[id number],08657.84^[id number])^[id number]^29^[id number]^        ________________________________________________________________________  Mother's Name: Karina Torres Type of delivery:  vaginal Breastfeeding Experience:  Pumped for NICU baby x 2 weeks Maternal Medical Conditions:  none Maternal Medications:  Percocet, ibuprofen  ________________________________________________________________________  Breastfeeding History (Post Discharge)  Frequency of breastfeeding:  Every 2-3 hours Duration of feeding: 30-45 minutes    Pumping  Type of pump:  Medela pump in style Frequency:  2-3 times per day Volume:   75+ml  Infant Intake and Output Assessment  Voids: 4+ in 24 hrs.  Color:  Clear yellow Stools: 6 in 24 hrs.  Color:  Yellow  ________________________________________________________________________  Maternal Breast Assessment  Breast:  Full Nipple:  Erect and Cracked  Pain interventions:  Comfort gels  _______________________________________________________________________ Feeding Assessment/Evaluation  Mom and 41 day old baby here for feeding assessment.  Mom has been using a 20 mm nipple shield and c/o sore nipples.  Both nipple have small crack on tip.  Assisted her with correct positioning for cross cradle hold.  She has been using cradle or football at home.  Reminded to wait for a very  wide mouth and then bring baby to breast.  Attempted to latch baby without the shield first but baby unable to maintain latch.  Increased nipple shield to a 24 mm.  Baby latched easily and well.  Mom states feeding much more comfortable.  Baby nursed well on right breast for 30 minutes, burped and then nursed on left breast for 15 minutes.  Nipple shield full of milk when baby came off.  Baby very relaxed and content after excellent milk transfer.  Comfort gels applied with instructions.  Patient is receptive to teaching.  Questions  answered.  Encouraged to attend support group and call with further questions or concerns.  Initial feeding assessment:  Infant's oral assessment:  WNL  Positioning:  Cross cradle Right breast  LATCH documentation:  Latch:  2 = Grasps breast easily, tongue down, lips flanged, rhythmical sucking. WITH 24 MM NIPPLE SHIELD  Audible swallowing:  2 = Spontaneous and intermittent  Type of nipple:  2 = Everted at rest and after stimulation  Comfort (Breast/Nipple):  1 = Filling, red/small blisters or bruises, mild/mod discomfort  Hold (Positioning):  1 = Assistance needed to correctly position infant at breast and maintain latch  LATCH score:  8  Attached assessment:  Deep  Lips flanged:  Yes.    Lips untucked:  Yes.    Suck assessment:  Nutritive  Tools:  Nipple shield 24 mm Instructed on use and cleaning of tool:  Yes.    Pre-feed weight:    (6 lb. 14.9oz.) Post-feed weight:  (7 lb. 2.7 oz.) Amount transferred: approx  100 mls Amount supplemented:  0 ml

## 2016-03-19 ENCOUNTER — Encounter: Payer: Self-pay | Admitting: *Deleted

## 2017-06-04 ENCOUNTER — Encounter (HOSPITAL_COMMUNITY): Payer: Self-pay | Admitting: Emergency Medicine

## 2017-06-04 ENCOUNTER — Other Ambulatory Visit: Payer: Self-pay

## 2017-06-04 ENCOUNTER — Emergency Department (HOSPITAL_COMMUNITY)
Admission: EM | Admit: 2017-06-04 | Discharge: 2017-06-04 | Disposition: A | Discharge status: Home / Self Care | Payer: Self-pay | Attending: Emergency Medicine | Admitting: Emergency Medicine

## 2017-06-04 DIAGNOSIS — G43909 Migraine, unspecified, not intractable, without status migrainosus: Secondary | ICD-10-CM | POA: Insufficient documentation

## 2017-06-04 DIAGNOSIS — Z79899 Other long term (current) drug therapy: Secondary | ICD-10-CM | POA: Insufficient documentation

## 2017-06-04 DIAGNOSIS — R1110 Vomiting, unspecified: Secondary | ICD-10-CM | POA: Insufficient documentation

## 2017-06-04 DIAGNOSIS — F1210 Cannabis abuse, uncomplicated: Secondary | ICD-10-CM | POA: Insufficient documentation

## 2017-06-04 DIAGNOSIS — M542 Cervicalgia: Secondary | ICD-10-CM | POA: Insufficient documentation

## 2017-06-04 LAB — POC URINE PREG, ED: Preg Test, Ur: NEGATIVE

## 2017-06-04 NOTE — Discharge Instructions (Signed)
Please read attached information. If you experience any new or worsening signs or symptoms please return to the emergency room for evaluation. Please follow-up with your primary care provider or specialist as discussed.  °

## 2017-06-04 NOTE — ED Triage Notes (Signed)
Pt c/o of HA x 1 week. Pt states the HA will switch from one side of her head to the other. Pt states HA went away yesterday but came back today followed by N/V x 1. Pt noticed red in her vomit and denies eating anything red today.  Pt has sensitivity to light and sound.  Pt took Alka seltzer for pain with little relief. A&Ox4, ambulatory.

## 2017-06-04 NOTE — ED Provider Notes (Signed)
[facility] Provider Note   CSN: [id number] Arrival date & time: 06/04/17  1613     History   Chief Complaint Chief Complaint  Patient presents with  . Headache    HPI Karina Torres is a 31 y.o. female.  HPI   [age] year old female presents today with complaints of migraine.  Patient reports history of the same, typical of previous except that she is having them more often.  Patient denies any headache now, reports she took a nap in the waiting room which resolved her symptoms.  She also reports a minor pain in the musculature of her neck, none presently.  She denies any fever, red flags.  Patient reports that she felt slightly nauseous while getting ready for work today had an episode of vomiting with a little bit of red blood.  She denies any dark stools, bloody stools, abdominal pain, bleeding disorder.  No more vomiting since the initial episode.     Past Medical History:  Diagnosis Date  . Hx of chlamydia infection   . Hx of HELLP syndrome, currently pregnant     Patient Active Problem List   Diagnosis Date Noted  . Normal labor and delivery 05/22/2015  . NVD (normal vaginal delivery) 05/22/2015    Past Surgical History:  Procedure Laterality Date  . CESAREAN SECTION    . COLPOSCOPY      OB History    Gravida Para Term Preterm AB Living   2 2 1 1   2    SAB TAB Ectopic Multiple Live Births         0 2       Home Medications    Prior to Admission medications   Medication Sig Start Date End Date Taking? Authorizing Provider  aspirin-acetaminophen-caffeine (EXCEDRIN MIGRAINE) [phone] MG tablet Take 2 tablets by mouth every 6 (six) hours as needed (for headaches or migraines).    Yes [provider]  Aspirin-Acetaminophen-Caffeine (GOODY HEADACHE PO) Take 1 packet by mouth 2 (two) times daily as needed (for headaches).   Yes [provider]  Maryanna ShapeBALZIVA 0.4-35 MG-MCG tablet Take 1 tablet by mouth  daily. 05/04/17  Yes [provider]  ibuprofen (ADVIL,MOTRIN) 200 MG tablet Take 800 mg by mouth every 6 (six) hours as needed (for headaches).   Yes [provider]    Family History Family History  Problem Relation Age of Onset  . Asthma Mother   . Diabetes Mother   . Learning disabilities Son   . Diabetes Maternal Grandmother   . Diabetes Maternal Grandfather   . Alcohol abuse Neg Hx   . Arthritis Neg Hx   . Birth defects Neg Hx   . Cancer Neg Hx   . COPD Neg Hx   . Depression Neg Hx   . Drug abuse Neg Hx   . Early death Neg Hx   . Hearing loss Neg Hx   . Heart disease Neg Hx   . Hyperlipidemia Neg Hx   . Hypertension Neg Hx   . Kidney disease Neg Hx   . Mental illness Neg Hx   . Mental retardation Neg Hx   . Miscarriages / Stillbirths Neg Hx   . Stroke Neg Hx   . Vision loss Neg Hx   . Varicose Veins Neg Hx     Social History Social History   Tobacco Use  . Smoking status: Never Smoker  . Smokeless tobacco: Never Used  Substance Use Topics  . Alcohol  use: Yes    Comment: occasional use- not during pregnancy  . Drug use: Yes    Types: Marijuana     Allergies   Patient has no known allergies.   Review of Systems Review of Systems  All other systems reviewed and are negative.    Physical Exam Updated Vital Signs BP 114/86 (BP Location: Right Arm)   Pulse 73   Temp 98.6 F (37 C) (Oral)   Resp 16   Ht 5\' 1"  (1.549 m)   Wt 63 kg (139 lb)   LMP 05/27/2017 (Exact Date)   SpO2 100%   BMI 26.26 kg/m   Physical Exam  Constitutional: She is oriented to person, place, and time. She appears well-developed and well-nourished.  HENT:  Head: Normocephalic and atraumatic.  Eyes: Conjunctivae are normal. Pupils are equal, round, and reactive to light. Right eye exhibits no discharge. Left eye exhibits no discharge. No scleral icterus.  Neck: Normal range of motion. No JVD present. No tracheal deviation present.  Pulmonary/Chest: Effort  normal. No stridor.  Musculoskeletal:  Neck supple full active range of motion nontender to palpation  Neurological: She is alert and oriented to person, place, and time. No cranial nerve deficit or sensory deficit. She exhibits normal muscle tone. Coordination normal.  Psychiatric: She has a normal mood and affect. Her behavior is normal. Judgment and thought content normal.  Nursing note and vitals reviewed.    ED Treatments / Results  Labs (all labs ordered are listed, but only abnormal results are displayed) Labs Reviewed  POC URINE PREG, ED    EKG  EKG Interpretation None       Radiology No results found.  Procedures Procedures (including critical care time)  Medications Ordered in ED Medications - No data to display   Initial Impression / Assessment and Plan / ED Course  I have reviewed the triage vital signs and the nursing notes.  Pertinent labs & imaging results that were available during my care of the patient were reviewed by me and considered in my medical decision making (see chart for details).     Final Clinical Impressions(s) / ED Diagnoses   Final diagnoses:  Migraine without status migrainosus, not intractable, unspecified migraine type    Labs:   Imaging:  Consults:  Therapeutics:  Discharge Meds:   Assessment/Plan: 31 year old female presents today with complaints of migraine.  She is not presently having a migraine, did not take any medication prior to arrival, reports this improved with a nap.  I have low suspicion for any acute intracranial abnormality in this otherwise well-appearing female.  She did have an episode of vomiting today, no nausea now.  She is not having any abdominal pain.  She is not pregnant.  I encouraged her to follow-up as an outpatient with her primary care return immediately if any new or worsening signs or symptoms present.  Patient verbalized understanding and agreement to this plan had no further questions or  concerns at the time of discharge.    ED Discharge Orders    None       Rosalio LoudHedges, Harrietta Incorvaia, PA-C 06/04/17 2042    Raeford RazorKohut, Stephen, MD 06/10/17 [phone]

## 2020-02-17 ENCOUNTER — Other Ambulatory Visit: Payer: Self-pay

## 2020-02-17 ENCOUNTER — Ambulatory Visit
Admission: EM | Admit: 2020-02-17 | Discharge: 2020-02-17 | Disposition: A | Discharge status: Home / Self Care | Payer: BC Managed Care – PPO

## 2020-02-17 DIAGNOSIS — S161XXA Strain of muscle, fascia and tendon at neck level, initial encounter: Secondary | ICD-10-CM

## 2020-02-17 DIAGNOSIS — M542 Cervicalgia: Secondary | ICD-10-CM | POA: Diagnosis not present
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
## 2020-02-17 NOTE — Discharge Instructions (Signed)
Take ibuprofen as needed for your pain.    Take the muscle relaxer Flexeril as needed for muscle spasm; Do not drive, operate machinery, or drink alcohol with this medication as it may make you drowsy.    Follow up with your primary care provider or an orthopedist if your pain is not improving.     

## 2020-02-18 ENCOUNTER — Telehealth: Payer: Self-pay

## 2020-02-18 MED ORDER — CYCLOBENZAPRINE HCL 10 MG PO TABS: 10.0000 mg | ORAL_TABLET | Freq: Two times a day (BID) | ORAL | 0 refills | Status: DC | PRN
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
## 2020-09-09 ENCOUNTER — Telehealth: Payer: BC Managed Care – PPO | Admitting: Orthopedic Surgery

## 2020-09-09 DIAGNOSIS — N760 Acute vaginitis: Secondary | ICD-10-CM | POA: Diagnosis not present

## 2020-09-09 MED ORDER — CLINDAMYCIN PHOSPHATE 2 % VA CREA: 1.0000 | TOPICAL_CREAM | Freq: Every day | VAGINAL | 0 refills | Status: DC

## 2020-09-09 NOTE — Progress Notes (Signed)
We are sorry that you are not feeling well. Here is how we plan to help! Based on what you shared with me it looks like you: May have a yeast vaginosis  Vaginosis is an inflammation of the vagina that can result in discharge, itching and pain. The cause is usually a change in the normal balance of vaginal bacteria or an infection. Vaginosis can also result from reduced estrogen levels after menopause.  The most common causes of vaginosis are:   Bacterial vaginosis which results from an overgrowth of one on several organisms that are normally present in your vagina.   Yeast infections which are caused by a naturally occurring fungus called candida.   Vaginal atrophy (atrophic vaginosis) which results from the thinning of the vagina from reduced estrogen levels after menopause.   Trichomoniasis which is caused by a parasite and is commonly transmitted by sexual intercourse.  Factors that increase your risk of developing vaginosis include: Marland Kitchen Medications, such as antibiotics and steroids . Uncontrolled diabetes . Use of hygiene products such as bubble bath, vaginal spray or vaginal deodorant . Douching . Wearing damp or tight-fitting clothing . Using an intrauterine device (IUD) for birth control . Hormonal changes, such as those associated with pregnancy, birth control pills or menopause . Sexual activity . Having a sexually transmitted infection  Your treatment plan is Clindamycin vaginal cream 5 grams applied vaginally for 7 days.  I have electronically sent this prescription into the pharmacy that you have chosen.  Be sure to take all of the medication as directed. Stop taking any medication if you develop a rash, tongue swelling or shortness of breath. Mothers who are breast feeding should consider pumping and discarding their breast milk while on these antibiotics. However, there is no consensus that infant exposure at these doses would be harmful.  Remember that medication creams can  weaken latex condoms. Marland Kitchen   HOME CARE:  Good hygiene may prevent some types of vaginosis from recurring and may relieve some symptoms:  . Avoid baths, hot tubs and whirlpool spas. Rinse soap from your outer genital area after a shower, and dry the area well to prevent irritation. Don't use scented or harsh soaps, such as those with deodorant or antibacterial action. Marland Kitchen Avoid irritants. These include scented tampons and pads. . Wipe from front to back after using the toilet. Doing so avoids spreading fecal bacteria to your vagina.  Other things that may help prevent vaginosis include:  Marland Kitchen Don't douche. Your vagina doesn't require cleansing other than normal bathing. Repetitive douching disrupts the normal organisms that reside in the vagina and can actually increase your risk of vaginal infection. Douching won't clear up a vaginal infection. . Use a latex condom. Both female and female latex condoms may help you avoid infections spread by sexual contact. . Wear cotton underwear. Also wear pantyhose with a cotton crotch. If you feel comfortable without it, skip wearing underwear to bed. Yeast thrives in [employer] Your symptoms should improve in the next day or two.  GET HELP RIGHT AWAY IF:  . You have pain in your lower abdomen ( pelvic area or over your ovaries) . You develop nausea or vomiting . You develop a fever . Your discharge changes or worsens . You have persistent pain with intercourse . You develop shortness of breath, a rapid pulse, or you faint.  These symptoms could be signs of problems or infections that need to be evaluated by a medical provider now.  MAKE SURE  YOU    Understand these instructions.  Will watch your condition.  Will get help right away if you are not doing well or get worse.  Your e-visit answers were reviewed by a board certified advanced clinical practitioner to complete your personal care plan. Depending upon the condition, your plan could  have included both over the counter or prescription medications. Please review your pharmacy choice to make sure that you have choses a pharmacy that is open for you to pick up any needed prescription, Your safety is important to [place]. If you have drug allergies check your prescription carefully.   You can use MyChart to ask questions about today's visit, request a non-urgent call back, or ask for a work or school excuse for 24 hours related to this e-Visit. If it has been greater than 24 hours you will need to follow up with your provider, or enter a new e-Visit to address those concerns. You will get a MyChart message within the next two days asking about your experience. I hope that your e-visit has been valuable and will speed your recovery.   Greater than 5 minutes, yet less than 10 minutes of time have been spent researching, coordinating and implementing care for this patient today.

## 2020-09-26 ENCOUNTER — Ambulatory Visit (HOSPITAL_COMMUNITY)
Admission: EM | Admit: 2020-09-26 | Discharge: 2020-09-26 | Disposition: A | Discharge status: Home / Self Care | Payer: BC Managed Care – PPO | Attending: Emergency Medicine | Admitting: Emergency Medicine

## 2020-09-26 ENCOUNTER — Telehealth: Payer: BC Managed Care – PPO | Admitting: Physician Assistant

## 2020-09-26 ENCOUNTER — Other Ambulatory Visit: Payer: Self-pay

## 2020-09-26 ENCOUNTER — Encounter (HOSPITAL_COMMUNITY): Payer: Self-pay

## 2020-09-26 DIAGNOSIS — G8929 Other chronic pain: Secondary | ICD-10-CM

## 2020-09-26 DIAGNOSIS — M549 Dorsalgia, unspecified: Secondary | ICD-10-CM | POA: Diagnosis not present

## 2020-09-26 DIAGNOSIS — M545 Low back pain, unspecified: Secondary | ICD-10-CM

## 2020-09-26 DIAGNOSIS — M25511 Pain in right shoulder: Secondary | ICD-10-CM | POA: Diagnosis not present

## 2020-09-26 DIAGNOSIS — M25512 Pain in left shoulder: Secondary | ICD-10-CM | POA: Diagnosis not present

## 2020-09-26 MED ORDER — METHOCARBAMOL 500 MG PO TABS: 500.0000 mg | ORAL_TABLET | Freq: Two times a day (BID) | ORAL | 0 refills | Status: DC | PRN

## 2020-09-26 MED ORDER — IBUPROFEN 600 MG PO TABS: 600.0000 mg | ORAL_TABLET | Freq: Four times a day (QID) | ORAL | 0 refills | Status: DC | PRN

## 2020-09-26 NOTE — ED Provider Notes (Signed)
[facility]    CSN: [id number] Arrival date & time: 09/26/20  1808      History   Chief Complaint Chief Complaint  Patient presents with  . Neck Pain  . Shoulder Pain  . Back Pain    HPI Karina Torres is a 35 y.o. female.   Patient presents with bilateral shoulder pain, upper back pain, and lower back pain for 1 month.  No falls or injury.  She uses her shoulders and back at work and thinks this may contribute to her symptoms.  She states the pain feels like muscular strain.  She denies numbness, weakness, paresthesias, redness, bruising, wounds, saddle anesthesia, loss of bowel/bladder control, abdominal pain, dysuria, or other symptoms.  No treatments attempted at home.  No pertinent medical history.    The history is provided by the patient.    Past Medical History:  Diagnosis Date  . Hx of chlamydia infection   . Hx of HELLP syndrome, currently pregnant     Patient Active Problem List   Diagnosis Date Noted  . Normal labor and delivery 05/22/2015  . NVD (normal vaginal delivery) 05/22/2015    Past Surgical History:  Procedure Laterality Date  . CESAREAN SECTION    . COLPOSCOPY      OB History    Gravida  2   Para  2   Term  1   Preterm  1   AB      Living  2     SAB      IAB      Ectopic      Multiple  0   Live Births  2            Home Medications    Prior to Admission medications   Medication Sig Start Date End Date Taking? Authorizing Provider  ibuprofen (ADVIL) 600 MG tablet Take 1 tablet (600 mg total) by mouth every 6 (six) hours as needed. 09/26/20  Yes Mickie Bail, NP  methocarbamol (ROBAXIN) 500 MG tablet Take 1 tablet (500 mg total) by mouth 2 (two) times daily as needed for muscle spasms. 09/26/20  Yes Mickie Bail, NP  aspirin-acetaminophen-caffeine (EXCEDRIN MIGRAINE) [phone] MG tablet Take 2 tablets by mouth every 6 (six) hours as needed (for headaches or migraines).     [provider]   Aspirin-Acetaminophen-Caffeine (GOODY HEADACHE PO) Take 1 packet by mouth 2 (two) times daily as needed (for headaches).    [provider]  BALZIVA 0.4-35 MG-MCG tablet Take 1 tablet by mouth daily. 05/04/17   [provider]  clindamycin (CLEOCIN) 2 % vaginal cream Place 1 Applicatorful vaginally at bedtime. 09/09/20   Freeman Caldron, PA-C    Family History Family History  Problem Relation Age of Onset  . Asthma Mother   . Diabetes Mother   . Learning disabilities Son   . Diabetes Maternal Grandmother   . Diabetes Maternal Grandfather   . Diabetes Father   . Alcohol abuse Neg Hx   . Arthritis Neg Hx   . Birth defects Neg Hx   . Cancer Neg Hx   . COPD Neg Hx   . Depression Neg Hx   . Drug abuse Neg Hx   . Early death Neg Hx   . Hearing loss Neg Hx   . Heart disease Neg Hx   . Hyperlipidemia Neg Hx   . Hypertension Neg Hx   . Kidney disease Neg Hx   . Mental illness  Neg Hx   . Mental retardation Neg Hx   . Miscarriages / Stillbirths Neg Hx   . Stroke Neg Hx   . Vision loss Neg Hx   . Varicose Veins Neg Hx     Social History Social History   Tobacco Use  . Smoking status: Never Smoker  . Smokeless tobacco: Never Used  Substance Use Topics  . Alcohol use: Yes    Comment: occasional use- not during pregnancy  . Drug use: Yes    Types: Marijuana     Allergies   Patient has no known allergies.   Review of Systems Review of Systems  Constitutional: Negative for chills and fever.  HENT: Negative for ear pain and sore throat.   Eyes: Negative for pain and visual disturbance.  Respiratory: Negative for cough and shortness of breath.   Cardiovascular: Negative for chest pain and palpitations.  Gastrointestinal: Negative for abdominal pain and vomiting.  Genitourinary: Negative for dysuria and hematuria.  Musculoskeletal: Positive for arthralgias, back pain and myalgias. Negative for gait problem.  Skin: Negative for color change, rash and  wound.  Neurological: Negative for syncope, weakness and numbness.  All other systems reviewed and are negative.    Physical Exam Triage Vital Signs ED Triage Vitals  Enc Vitals Group     BP      Pulse      Resp      Temp      Temp src      SpO2      Weight      Height      Head Circumference      Peak Flow      Pain Score      Pain Loc      Pain Edu?      Excl. in GC?    No data found.  Updated Vital Signs BP 123/77 (BP Location: Right Arm)   Pulse 64   Temp 98.9 F (37.2 C) (Oral)   Resp 17   LMP 09/03/2020 (Exact Date)   SpO2 100%   Visual Acuity Right Eye Distance:   Left Eye Distance:   Bilateral Distance:    Right Eye Near:   Left Eye Near:    Bilateral Near:     Physical Exam Vitals and nursing note reviewed.  Constitutional:      General: She is not in acute distress.    Appearance: She is well-developed. She is not ill-appearing.  HENT:     Head: Normocephalic and atraumatic.     Mouth/Throat:     Mouth: Mucous membranes are moist.  Eyes:     Conjunctiva/sclera: Conjunctivae normal.  Cardiovascular:     Rate and Rhythm: Normal rate and regular rhythm.     Heart sounds: Normal heart sounds.  Pulmonary:     Effort: Pulmonary effort is normal. No respiratory distress.     Breath sounds: Normal breath sounds.  Abdominal:     Palpations: Abdomen is soft.     Tenderness: There is no abdominal tenderness.  Musculoskeletal:        General: Tenderness present. No swelling or deformity. Normal range of motion.     Cervical back: Neck supple.     Comments: Tender to palpation of muscles of shoulders, upper back, and lower back.  Extremities: Strength 5/5, sensation intact, 2+ pulses.  Skin:    General: Skin is warm and dry.     Capillary Refill: Capillary refill takes less than 2 seconds.  Findings: No bruising, erythema, lesion or rash.  Neurological:     General: No focal deficit present.     Mental Status: She is alert and oriented to  person, place, and time.     Sensory: No sensory deficit.     Motor: No weakness.     Gait: Gait normal.  Psychiatric:        Mood and Affect: Mood normal.        Behavior: Behavior normal.      UC Treatments / Results  Labs (all labs ordered are listed, but only abnormal results are displayed) Labs Reviewed - No data to display  EKG   Radiology No results found.  Procedures Procedures (including critical care time)  Medications Ordered in UC Medications - No data to display  Initial Impression / Assessment and Plan / UC Course  I have reviewed the triage vital signs and the nursing notes.  Pertinent labs & imaging results that were available during my care of the patient were reviewed by me and considered in my medical decision making (see chart for details).   Bilateral shoulder pain, upper back pain, lower back pain without sciatica.  No indication of acute injury or infection.  Pain appears to be muscular in nature.  Treating with ibuprofen and Robaxin.  Precautions for drowsiness with Robaxin discussed.  Discussed follow-up with either her PCP or an orthopedist if her symptoms are not improving.  Patient agrees to plan of care.   Final Clinical Impressions(s) / UC Diagnoses   Final diagnoses:  Acute pain of both shoulders  Other acute back pain  Acute bilateral low back pain without sciatica     Discharge Instructions     Take ibuprofen as needed for discomfort.  Take the muscle relaxer as needed for muscle spasm; Do not drive, operate machinery, or drink alcohol with this medication as it can cause drowsiness.   Follow up with your primary care provider or an orthopedist if your symptoms are not improving.        ED Prescriptions    Medication Sig Dispense Auth. Provider   ibuprofen (ADVIL) 600 MG tablet Take 1 tablet (600 mg total) by mouth every 6 (six) hours as needed. 30 tablet Mickie Bail, NP   methocarbamol (ROBAXIN) 500 MG tablet Take 1 tablet  (500 mg total) by mouth 2 (two) times daily as needed for muscle spasms. 10 tablet Mickie Bail, NP     PDMP not reviewed this encounter.   Mickie Bail, NP 09/26/20 [phone]

## 2020-09-26 NOTE — Discharge Instructions (Signed)
Take ibuprofen as needed for discomfort.  Take the muscle relaxer as needed for muscle spasm; Do not drive, operate machinery, or drink alcohol with this medication as it can cause drowsiness.   Follow up with your primary care provider or an orthopedist if your symptoms are not improving.     

## 2020-09-26 NOTE — Progress Notes (Signed)
Based on what you shared with me, I feel your condition warrants further evaluation and I recommend that you be seen for a face to face office visit. Giving the chronicity of your symptoms, you need to be evaluated by your primary care provider to determine the most appropriate ongoing management for your pain. This is not something we are able to do via e-visit or video on demand visit. Please reach out to your primary provider. If you do not have one, you can go to [email or web address] to see a list of local primary care providers near you. I have also listed some local Urgent cares for evaluation in the meantime.    NOTE: If you entered your credit card information for this eVisit, you will not be charged. You may see a "hold" on your card for the $35 but that hold will drop off and you will not have a charge processed.   If you are having a true medical emergency please call 911.      For an urgent face to face visit, [place] has five urgent care centers for your convenience:     [facility] at [place] Directions [phone] [address] [place], [place] [address] . 10 am - 6pm Monday - Friday    [facility]) Get Driving Directions [phone] [address] [place], [place] [address] . 10 am to 8 pm Monday-Friday . 12 pm to 8 pm [facility]gent Care at [facility] Get Driving Directions [phone] [address] [place], [place] [address] . 8 am to 8 pm Monday-Friday . 9 am to 6 pm Saturday . 11 am to 6 pm Sunday     [facility] Care at [facility] Get Driving Directions  [phone] [address] [place], [place] [address] . 8 am to 8 pm Monday-Friday . 8 am to 4 pm [facility] Urgent Care at [facility] Directions [phone] [address] [place], [place] [address] . 12 pm to 6  pm Monday-Friday      Your e-visit answers were reviewed by a board certified advanced clinical practitioner to complete your personal care plan.  Thank you for using e-Visits.
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
## 2020-12-14 ENCOUNTER — Telehealth: Payer: BC Managed Care – PPO | Admitting: Physician Assistant

## 2020-12-14 DIAGNOSIS — R21 Rash and other nonspecific skin eruption: Secondary | ICD-10-CM

## 2020-12-14 MED ORDER — PREDNISONE 10 MG (21) PO TBPK: ORAL_TABLET | ORAL | 0 refills | Status: DC

## 2020-12-14 NOTE — Progress Notes (Signed)
E Visit for Rash  We are sorry that you are not feeling well. Here is how we plan to help!  Based on what you shared with me it looks like you have contact dermatitis.  Contact dermatitis is a skin rash caused by something that touches the skin and causes irritation or inflammation.  Your skin may be red, swollen, dry, cracked, and itch.  The rash should go away in a few days but can last a few weeks.  If you get a rash, it's important to figure out what caused it so the irritant can be avoided in the future.  Prednisone 10 mg daily for 6 days (see taper instructions below)  Directions for 6 day taper: Day 1: 2 tablets before breakfast, 1 after both lunch & dinner and 2 at bedtime Day 2: 1 tab before breakfast, 1 after both lunch & dinner and 2 at bedtime Day 3: 1 tab at each meal & 1 at bedtime Day 4: 1 tab at breakfast, 1 at lunch, 1 at bedtime Day 5: 1 tab at breakfast & 1 tab at bedtime Day 6: 1 tab at breakfast   HOME CARE:   Take cool showers and avoid direct sunlight.  Apply cool compress or wet dressings.  Take a bath in an oatmeal bath.  Sprinkle content of one Aveeno packet under running faucet with comfortably warm water.  Bathe for 15-20 minutes, 1-2 times daily.  Pat dry with a towel. Do not rub the rash.  Use hydrocortisone cream.  Take an antihistamine like Benadryl for widespread rashes that itch.  The adult dose of Benadryl is 25-50 mg by mouth 4 times daily.  Caution:  This type of medication may cause sleepiness.  Do not drink alcohol, drive, or operate dangerous machinery while taking antihistamines.  Do not take these medications if you have prostate enlargement.  Read package instructions thoroughly on all medications that you take.  GET HELP RIGHT AWAY IF:   Symptoms don't go away after treatment.  Severe itching that persists.  If you rash spreads or swells.  If you rash begins to smell.  If it blisters and opens or develops a yellow-brown  crust.  You develop a fever.  You have a sore throat.  You become short of breath.  MAKE SURE YOU:  Understand these instructions. Will watch your condition. Will get help right away if you are not doing well or get worse.  Thank you for choosing an e-visit. Your e-visit answers were reviewed by a board certified advanced clinical practitioner to complete your personal care plan. Depending upon the condition, your plan could have included both over the counter or prescription medications. Please review your pharmacy choice. Be sure that the pharmacy you have chosen is open so that you can pick up your prescription now.  If there is a problem you may message your provider in MyChart to have the prescription routed to another pharmacy. Your safety is important to us. If you have drug allergies check your prescription carefully.  For the next 24 hours, you can use MyChart to ask questions about today's visit, request a non-urgent call back, or ask for a work or school excuse from your e-visit provider. You will get an email in the next two days asking about your experience. I hope that your e-visit has been valuable and will speed your recovery.  I provided 5 minutes of non face-to-face time during this encounter for chart review and documentation.   

## 2020-12-19 ENCOUNTER — Encounter (HOSPITAL_BASED_OUTPATIENT_CLINIC_OR_DEPARTMENT_OTHER): Payer: Self-pay

## 2020-12-19 ENCOUNTER — Emergency Department (HOSPITAL_BASED_OUTPATIENT_CLINIC_OR_DEPARTMENT_OTHER)
Admission: EM | Admit: 2020-12-19 | Discharge: 2020-12-19 | Disposition: A | Discharge status: Home / Self Care | Payer: BC Managed Care – PPO | Attending: Emergency Medicine | Admitting: Emergency Medicine

## 2020-12-19 ENCOUNTER — Other Ambulatory Visit: Payer: Self-pay

## 2020-12-19 ENCOUNTER — Telehealth: Payer: BC Managed Care – PPO | Admitting: Physician Assistant

## 2020-12-19 DIAGNOSIS — M549 Dorsalgia, unspecified: Secondary | ICD-10-CM

## 2020-12-19 DIAGNOSIS — N390 Urinary tract infection, site not specified: Secondary | ICD-10-CM | POA: Insufficient documentation

## 2020-12-19 DIAGNOSIS — N3000 Acute cystitis without hematuria: Secondary | ICD-10-CM

## 2020-12-19 DIAGNOSIS — R109 Unspecified abdominal pain: Secondary | ICD-10-CM

## 2020-12-19 DIAGNOSIS — R310 Gross hematuria: Secondary | ICD-10-CM

## 2020-12-19 LAB — URINALYSIS, ROUTINE W REFLEX MICROSCOPIC
Bilirubin Urine: NEGATIVE
Glucose, UA: NEGATIVE mg/dL
Hgb urine dipstick: NEGATIVE
Ketones, ur: NEGATIVE mg/dL
Nitrite: POSITIVE — AB
Protein, ur: NEGATIVE mg/dL
Specific Gravity, Urine: 1.019 (ref 1.005–1.030)
pH: 6.5 (ref 5.0–8.0)

## 2020-12-19 LAB — PREGNANCY, URINE: Preg Test, Ur: NEGATIVE

## 2020-12-19 MED ORDER — CEPHALEXIN 250 MG PO CAPS
500.0000 mg | ORAL_CAPSULE | Freq: Once | ORAL | Status: AC
  Administered 2020-12-19: 500 mg via ORAL
  Filled 2020-12-19: qty 2

## 2020-12-19 MED ORDER — CEPHALEXIN 500 MG PO CAPS: 500.0000 mg | ORAL_CAPSULE | Freq: Four times a day (QID) | ORAL | 0 refills | Status: DC

## 2020-12-19 NOTE — Progress Notes (Signed)
Based on what you shared with me, I feel your condition warrants further evaluation and I recommend that you be seen in a face to face visit. Giving back/belly pain along with blood in the urine, you need an in-person evaluation to get urine testing to see if any concern for bladder infection, but really to look into other, more concerning causes of blood in the urine -- stone, etc. This is above the scope of what we can do via e-visit or video urgent care visit.    NOTE: There will be NO CHARGE for this eVisit   If you are having a true medical emergency please call 911.      For an urgent face to face visit, [place] has six urgent care centers for your convenience:     [facility] at [facility] Directions [phone] [address] [place], [place] [address]    [facility]) Get Driving Directions [phone] [address] [place], [place] [address]  [facility] Urgent Care Center [facility]) Get Driving Directions [phone] [address] [place],  [place]  [address]  [facility] Health Urgent Care at [facility] Get Driving Directions [phone] [address] [place], [place] [address]   [facility] Health Urgent Care at [facility] Get Driving Directions  [phone] [address] [place], [place] [address]   [facility] Urgent Care at [facility] Directions [phone] [address], [address] [place], [place] [address]  Your MyChart E-visit questionnaire answers were reviewed by a board certified advanced clinical practitioner to complete your personal care plan based on your specific symptoms.  Thank you for using e-Visits.

## 2020-12-19 NOTE — ED Triage Notes (Signed)
Patient here POV from Home with Urinary Symptoms.  Patient states her urine has been malodorous since this AM. No other symptoms such as Burning or Frequency.  Ambulatory, [facility]CS 15

## 2020-12-19 NOTE — ED Notes (Signed)
This RN presented the AVS utilizing Teachback Method. Patient verbalizes understanding of Discharge Instructions. Opportunity for Questioning and Answers were provided. Patient Discharged from ED ambulatory to Home.   

## 2020-12-19 NOTE — ED Provider Notes (Signed)
[facility] EMERGENCY DEPT Provider Note   CSN: [id number] Arrival date & time: 12/19/20  2155     History Chief Complaint  Patient presents with   Urinary Tract Infection    Karina Torres is a 35 y.o. female.  The history is provided by the patient.  Urinary Tract Infection Quality: malodorous urine since this am, no burning. Onset quality:  Gradual Duration:  1 day Timing:  Constant Progression:  Unchanged Chronicity:  New Relieved by:  Nothing Worsened by:  Nothing Ineffective treatments:  None tried Urinary symptoms: foul-smelling urine   Urinary symptoms: no discolored urine, no frequent urination and no hesitancy   Associated symptoms: no abdominal pain, no fever and no flank pain   Risk factors: not pregnant and no single kidney       Past Medical History:  Diagnosis Date   Hx of chlamydia infection    Hx of HELLP syndrome, currently pregnant     Patient Active Problem List   Diagnosis Date Noted   Normal labor and delivery 05/22/2015   NVD (normal vaginal delivery) 05/22/2015    Past Surgical History:  Procedure Laterality Date   CESAREAN SECTION     COLPOSCOPY       OB History     Gravida  2   Para  2   Term  1   Preterm  1   AB      Living  2      SAB      IAB      Ectopic      Multiple  0   Live Births  2           Family History  Problem Relation Age of Onset   Asthma Mother    Diabetes Mother    Learning disabilities Son    Diabetes Maternal Grandmother    Diabetes Maternal Grandfather    Diabetes Father    Alcohol abuse Neg Hx    Arthritis Neg Hx    Birth defects Neg Hx    Cancer Neg Hx    COPD Neg Hx    Depression Neg Hx    Drug abuse Neg Hx    Early death Neg Hx    Hearing loss Neg Hx    Heart disease Neg Hx    Hyperlipidemia Neg Hx    Hypertension Neg Hx    Kidney disease Neg Hx    Mental illness Neg Hx    Mental retardation Neg Hx    Miscarriages / Stillbirths Neg Hx    Stroke  Neg Hx    Vision loss Neg Hx    Varicose Veins Neg Hx     Social History   Tobacco Use   Smoking status: Never   Smokeless tobacco: Never  Substance Use Topics   Alcohol use: Yes    Comment: occasional use- not during pregnancy   Drug use: Yes    Types: Marijuana    Home Medications Prior to Admission medications   Medication Sig Start Date End Date Taking? Authorizing Provider  aspirin-acetaminophen-caffeine (EXCEDRIN MIGRAINE) [phone] MG tablet Take 2 tablets by mouth every 6 (six) hours as needed (for headaches or migraines).     [provider]  Aspirin-Acetaminophen-Caffeine (GOODY HEADACHE PO) Take 1 packet by mouth 2 (two) times daily as needed (for headaches).    [provider]  clindamycin (CLEOCIN) 2 % vaginal cream Place 1 Applicatorful vaginally at bedtime. 09/09/20   Freeman Caldron, PA-C  ibuprofen (ADVIL) 600 MG tablet Take 1 tablet (600 mg total) by mouth every 6 (six) hours as needed. 09/26/20   Mickie Bail, NP  methocarbamol (ROBAXIN) 500 MG tablet Take 1 tablet (500 mg total) by mouth 2 (two) times daily as needed for muscle spasms. 09/26/20   Mickie Bail, NP  predniSONE (STERAPRED UNI-PAK 21 TAB) 10 MG (21) TBPK tablet 6 day taper; take as directed on package instructions 12/14/20   Margaretann Loveless, PA-C    Allergies    Patient has no known allergies.  Review of Systems   Review of Systems  Constitutional:  Negative for fever.  HENT:  Negative for drooling.   Eyes:  Negative for redness.  Respiratory:  Negative for wheezing.   Cardiovascular:  Negative for leg swelling.  Gastrointestinal:  Negative for abdominal pain.  Genitourinary:  Negative for dysuria, flank pain and frequency.  All other systems reviewed and are negative.  Physical Exam Updated Vital Signs BP (!) 138/106 (BP Location: Right Arm)   Pulse 60   Temp 98.1 F (36.7 C) (Oral)   Resp 16   Ht 5\' 1"  (1.549 m)   Wt 61.2 kg   SpO2 100%   BMI 25.51 kg/m    Physical Exam Vitals and nursing note reviewed.  Constitutional:      General: She is not in acute distress.    Appearance: Normal appearance.  HENT:     Head: Normocephalic and atraumatic.     Nose: Nose normal.  Eyes:     Conjunctiva/sclera: Conjunctivae normal.     Pupils: Pupils are equal, round, and reactive to light.  Cardiovascular:     Rate and Rhythm: Normal rate and regular rhythm.     Pulses: Normal pulses.     Heart sounds: Normal heart sounds.  Pulmonary:     Effort: Pulmonary effort is normal.     Breath sounds: Normal breath sounds.  Abdominal:     General: Abdomen is flat. Bowel sounds are normal.     Palpations: Abdomen is soft.     Tenderness: There is no abdominal tenderness. There is no guarding.  Musculoskeletal:        General: Normal range of motion.     Cervical back: Normal range of motion and neck supple.  Skin:    General: Skin is warm and dry.     Capillary Refill: Capillary refill takes less than 2 seconds.  Neurological:     General: No focal deficit present.     Mental Status: She is alert and oriented to person, place, and time.  Psychiatric:        Mood and Affect: Mood normal.        Behavior: Behavior normal.    ED Results / Procedures / Treatments   Labs (all labs ordered are listed, but only abnormal results are displayed) Labs Reviewed  URINALYSIS, ROUTINE W REFLEX MICROSCOPIC - Abnormal; Notable for the following components:      Result Value   Nitrite POSITIVE (*)    Leukocytes,Ua TRACE (*)    Bacteria, UA MANY (*)    All other components within normal limits  PREGNANCY, URINE    EKG None  Radiology No results found.  Procedures Procedures   Medications Ordered in ED Medications  cephALEXin (KEFLEX) capsule 500 mg (has no administration in time range)    ED Course  I have reviewed the triage vital signs and the nursing notes.  Pertinent labs & imaging results that  were available during my care of the patient  were reviewed by me and considered in my medical decision making (see chart for details).    UTI, will treat with keflex.  No signs of pyelonephritis or sepsis.  Stable for discharge with close follow up.   Karina Torres was evaluated in Emergency Department on 12/19/2020 for the symptoms described in the history of present illness. She was evaluated in the context of the global COVID-19 pandemic, which necessitated consideration that the patient might be at risk for infection with the SARS-CoV-2 virus that causes COVID-19. Institutional protocols and algorithms that pertain to the evaluation of patients at risk for COVID-19 are in a state of rapid change based on information released by regulatory bodies including the CDC and federal and state organizations. These policies and algorithms were followed during the patient's care in the ED.  Final Clinical Impression(s) / ED Diagnoses Final diagnoses:  None  Return for intractable cough, coughing up blood, fevers > 100.4 unrelieved by medication, shortness of breath, intractable vomiting, chest pain, shortness of breath, weakness, numbness, changes in speech, facial asymmetry, abdominal pain, passing out, Inability to tolerate liquids or food, cough, altered mental status or any concerns. No signs of systemic illness or infection. The patient is nontoxic-appearing on exam and vital signs are within normal limits. I have reviewed the triage vital signs and the nursing notes. Pertinent labs & imaging results that were available during my care of the patient were reviewed by me and considered in my medical decision making (see chart for details). After history, exam, and medical workup I feel the patient has been appropriately medically screened and is safe for discharge home. Pertinent diagnoses were discussed with the patient. Patient was given return precautions.   Rx / DC Orders ED Discharge Orders     None        Rydge Texidor, MD 12/19/20  2336

## 2020-12-24 ENCOUNTER — Telehealth: Payer: BC Managed Care – PPO | Admitting: Physician Assistant

## 2020-12-24 DIAGNOSIS — B373 Candidiasis of vulva and vagina: Secondary | ICD-10-CM | POA: Diagnosis not present

## 2020-12-24 DIAGNOSIS — B3731 Acute candidiasis of vulva and vagina: Secondary | ICD-10-CM

## 2020-12-24 MED ORDER — FLUCONAZOLE 150 MG PO TABS: 150.0000 mg | ORAL_TABLET | Freq: Once | ORAL | 0 refills | Status: AC

## 2020-12-24 NOTE — Progress Notes (Signed)

## 2021-02-25 ENCOUNTER — Telehealth: Payer: BC Managed Care – PPO | Admitting: Family

## 2021-02-25 DIAGNOSIS — R399 Unspecified symptoms and signs involving the genitourinary system: Secondary | ICD-10-CM

## 2021-02-25 MED ORDER — CEPHALEXIN 500 MG PO CAPS: 500.0000 mg | ORAL_CAPSULE | Freq: Two times a day (BID) | ORAL | 0 refills | Status: DC

## 2021-02-25 NOTE — Progress Notes (Signed)

## 2021-04-04 ENCOUNTER — Telehealth: Payer: BC Managed Care – PPO | Admitting: Physician Assistant

## 2021-04-04 DIAGNOSIS — N760 Acute vaginitis: Secondary | ICD-10-CM

## 2021-04-04 DIAGNOSIS — B9689 Other specified bacterial agents as the cause of diseases classified elsewhere: Secondary | ICD-10-CM | POA: Diagnosis not present

## 2021-04-04 MED ORDER — CLINDAMYCIN PHOSPHATE 2 % VA CREA: 1.0000 | TOPICAL_CREAM | Freq: Every day | VAGINAL | 0 refills | Status: DC

## 2021-04-04 MED ORDER — METRONIDAZOLE 500 MG PO TABS: 500.0000 mg | ORAL_TABLET | Freq: Two times a day (BID) | ORAL | 0 refills | Status: DC

## 2021-04-04 NOTE — Progress Notes (Signed)
I have spent 5 minutes in review of e-visit questionnaire, review and updating patient chart, medical decision making and response to patient.   Miguel Medal Cody Remie Mathison, PA-C    

## 2021-04-04 NOTE — Progress Notes (Signed)
E-Visit for Vaginal Symptoms  We are sorry that you are not feeling well. Here is how we plan to help! Based on what you shared with me it looks like you: May have a vaginosis due to bacteria  Vaginosis is an inflammation of the vagina that can result in discharge, itching and pain. The cause is usually a change in the normal balance of vaginal bacteria or an infection. Vaginosis can also result from reduced estrogen levels after menopause.  The most common causes of vaginosis are:   Bacterial vaginosis which results from an overgrowth of one on several organisms that are normally present in your vagina.   Yeast infections which are caused by a naturally occurring fungus called candida.   Vaginal atrophy (atrophic vaginosis) which results from the thinning of the vagina from reduced estrogen levels after menopause.   Trichomoniasis which is caused by a parasite and is commonly transmitted by sexual intercourse.  Factors that increase your risk of developing vaginosis include: Medications, such as antibiotics and steroids Uncontrolled diabetes Use of hygiene products such as bubble bath, vaginal spray or vaginal deodorant Douching Wearing damp or tight-fitting clothing Using an intrauterine device (IUD) for birth control Hormonal changes, such as those associated with pregnancy, birth control pills or menopause Sexual activity Having a sexually transmitted infection  Your treatment plan is Clindamycin vaginal cream 5 grams applied vaginally for 7 days.  I have electronically sent this prescription into the pharmacy that you have chosen.  Be sure to take all of the medication as directed. Stop taking any medication if you develop a rash, tongue swelling or shortness of breath. Mothers who are breast feeding should consider pumping and discarding their breast milk while on these antibiotics. However, there is no consensus that infant exposure at these doses would be harmful.  Remember  that medication creams can weaken latex condoms. .   HOME CARE:  Good hygiene may prevent some types of vaginosis from recurring and may relieve some symptoms:  Avoid baths, hot tubs and whirlpool spas. Rinse soap from your outer genital area after a shower, and dry the area well to prevent irritation. Don't use scented or harsh soaps, such as those with deodorant or antibacterial action. Avoid irritants. These include scented tampons and pads. Wipe from front to back after using the toilet. Doing so avoids spreading fecal bacteria to your vagina.  Other things that may help prevent vaginosis include:  Don't douche. Your vagina doesn't require cleansing other than normal bathing. Repetitive douching disrupts the normal organisms that reside in the vagina and can actually increase your risk of vaginal infection. Douching won't clear up a vaginal infection. Use a latex condom. Both female and female latex condoms may help you avoid infections spread by sexual contact. Wear cotton underwear. Also wear pantyhose with a cotton crotch. If you feel comfortable without it, skip wearing underwear to bed. Yeast thrives in moist environments Your symptoms should improve in the next day or two.  GET HELP RIGHT AWAY IF:  You have pain in your lower abdomen ( pelvic area or over your ovaries) You develop nausea or vomiting You develop a fever Your discharge changes or worsens You have persistent pain with intercourse You develop shortness of breath, a rapid pulse, or you faint.  These symptoms could be signs of problems or infections that need to be evaluated by a medical provider now.  MAKE SURE YOU   Understand these instructions. Will watch your condition. Will get help right   away if you are not doing well or get worse.  Thank you for choosing an e-visit.  Your e-visit answers were reviewed by a board certified advanced clinical practitioner to complete your personal care plan. Depending  upon the condition, your plan could have included both over the counter or prescription medications.  Please review your pharmacy choice. Make sure the pharmacy is open so you can pick up prescription now. If there is a problem, you may contact your provider through MyChart messaging and have the prescription routed to another pharmacy.  Your safety is important to us. If you have drug allergies check your prescription carefully.   For the next 24 hours you can use MyChart to ask questions about today's visit, request a non-urgent call back, or ask for a work or school excuse. You will get an email in the next two days asking about your experience. I hope that your e-visit has been valuable and will speed your recovery.  

## 2021-04-04 NOTE — Addendum Note (Signed)
Addended by: Waldon Merl on: 04/04/2021 06:51 PM   Modules accepted: Orders

## 2021-05-11 ENCOUNTER — Telehealth: Payer: BC Managed Care – PPO | Admitting: Nurse Practitioner

## 2021-05-11 DIAGNOSIS — R399 Unspecified symptoms and signs involving the genitourinary system: Secondary | ICD-10-CM

## 2021-05-11 MED ORDER — CEPHALEXIN 500 MG PO CAPS: 500.0000 mg | ORAL_CAPSULE | Freq: Two times a day (BID) | ORAL | 0 refills | Status: DC

## 2021-05-11 NOTE — Progress Notes (Signed)

## 2021-06-14 ENCOUNTER — Telehealth: Payer: BC Managed Care – PPO | Admitting: Physician Assistant

## 2021-06-14 DIAGNOSIS — R3989 Other symptoms and signs involving the genitourinary system: Secondary | ICD-10-CM | POA: Diagnosis not present

## 2021-06-14 MED ORDER — SULFAMETHOXAZOLE-TRIMETHOPRIM 800-160 MG PO TABS: 1.0000 | ORAL_TABLET | Freq: Two times a day (BID) | ORAL | 0 refills | Status: DC

## 2021-06-14 NOTE — Progress Notes (Signed)

## 2021-10-17 ENCOUNTER — Emergency Department (HOSPITAL_BASED_OUTPATIENT_CLINIC_OR_DEPARTMENT_OTHER)
Admission: EM | Admit: 2021-10-17 | Discharge: 2021-10-18 | Disposition: A | Discharge status: Home / Self Care | Payer: BC Managed Care – PPO | Attending: Emergency Medicine | Admitting: Emergency Medicine

## 2021-10-17 ENCOUNTER — Encounter (HOSPITAL_BASED_OUTPATIENT_CLINIC_OR_DEPARTMENT_OTHER): Payer: Self-pay

## 2021-10-17 ENCOUNTER — Other Ambulatory Visit: Payer: Self-pay

## 2021-10-17 DIAGNOSIS — K047 Periapical abscess without sinus: Secondary | ICD-10-CM | POA: Diagnosis not present

## 2021-10-17 LAB — GROUP A STREP BY PCR: Group A Strep by PCR: NOT DETECTED

## 2021-10-17 MED ORDER — PENICILLIN V POTASSIUM 500 MG PO TABS: 500.0000 mg | ORAL_TABLET | Freq: Four times a day (QID) | ORAL | 0 refills | Status: AC

## 2021-10-17 MED ORDER — IBUPROFEN 600 MG PO TABS: 600.0000 mg | ORAL_TABLET | Freq: Four times a day (QID) | ORAL | 0 refills | Status: DC | PRN

## 2021-10-17 NOTE — ED Triage Notes (Signed)
Patient here POV from Home with Gum Swelling. ? ?Endorses "Abscess" on Right Posterior Gumline that has been Painful and Present since Monday.  ? ?Possible Drainage from Site approximately at 1200.   ? ?Similar Occurrence 1 Year Ago. No Known Fevers. No N/V/D. ? ?NAD Noted during Triage. A&Ox4. GCS 15. Ambulatory.  ?

## 2021-10-17 NOTE — ED Provider Notes (Signed)
? ?  [facility] EMERGENCY DEPT  ?Provider Note ? ?CSN: [id number] ?Arrival date & time: 10/17/21 2046 ? ?History ?Chief Complaint  ?Patient presents with  ? Abscess  ? ? ?Karina Torres is a 35 y.o. female reports she had pain and swelling in the site of her remote R lower wisdom tooth removal earlier today. She has had abscess in that location before. She reports the area 'popped' and drained some pus earlier today. She also has had some sore throat on that side. No fevers.  ? ? ?Home Medications ?Prior to Admission medications   ?Medication Sig Start Date End Date Taking? Authorizing Provider  ?penicillin v potassium (VEETID) 500 MG tablet Take 1 tablet (500 mg total) by mouth 4 (four) times daily for 7 days. 10/17/21 10/24/21 Yes Pollyann Savoy, MD  ?ibuprofen (ADVIL) 600 MG tablet Take 1 tablet (600 mg total) by mouth every 6 (six) hours as needed. 10/17/21   Pollyann Savoy, MD  ? ? ? ?Allergies    ?Patient has no known allergies. ? ? ?Review of Systems   ?Review of Systems ?Please see HPI for pertinent positives and negatives ? ?Physical Exam ?BP (!) 132/99 (BP Location: Right Arm)   Pulse 87   Temp 98.2 ?F (36.8 ?C)   Resp 19   Ht 5\' 1"  (1.549 m)   Wt 61.2 kg   SpO2 100%   BMI 25.49 kg/m?  ? ?Physical Exam ?Vitals and nursing note reviewed.  ?HENT:  ?   Head: Normocephalic.  ?   Nose: Nose normal.  ?   Mouth/Throat:  ?   Comments: Mild gingival erythema and induration posterior to the R lower 2nd molar. No focal fluctuance.  ?Eyes:  ?   Extraocular Movements: Extraocular movements intact.  ?Pulmonary:  ?   Effort: Pulmonary effort is normal.  ?   Breath sounds: No stridor.  ?Musculoskeletal:     ?   General: Normal range of motion.  ?   Cervical back: Neck supple.  ?Lymphadenopathy:  ?   Cervical: No cervical adenopathy.  ?Skin: ?   Findings: No rash (on exposed skin).  ?Neurological:  ?   Mental Status: She is alert and oriented to person, place, and time.  ?Psychiatric:     ?   Mood  and Affect: Mood normal.  ? ? ?ED Results / Procedures / Treatments   ?EKG ?None ? ?Procedures ?Procedures ? ?Medications Ordered in the ED ?Medications - No data to display ? ?Initial Impression and Plan ? Patient with probably gingival abscess has spontaneously drained prior to arrival. She otherwise looks well. Strep was neg. Plan Rx for PCN, recommend dental follow up for further evaluation.  ? ?ED Course  ? ?  ? ? ?MDM Rules/Calculators/A&P ?Medical Decision Making ?Problems Addressed: ?Dental abscess: acute illness or injury ? ?Risk ?Prescription drug management. ? ? ? ?Final Clinical Impression(s) / ED Diagnoses ?Final diagnoses:  ?Dental abscess  ? ? ?Rx / DC Orders ?ED Discharge Orders   ? ?      Ordered  ?  ibuprofen (ADVIL) 600 MG tablet  Every 6 hours PRN       ? 10/17/21 2355  ?  penicillin v potassium (VEETID) 500 MG tablet  4 times daily       ? 10/17/21 2355  ? ?  ?  ? ?  ? ?  ?[id number], MD ?10/17/21 2355 ? ?

## 2021-11-11 ENCOUNTER — Other Ambulatory Visit: Payer: Self-pay

## 2021-11-11 ENCOUNTER — Encounter (HOSPITAL_BASED_OUTPATIENT_CLINIC_OR_DEPARTMENT_OTHER): Payer: Self-pay | Admitting: *Deleted

## 2021-11-11 ENCOUNTER — Emergency Department (HOSPITAL_BASED_OUTPATIENT_CLINIC_OR_DEPARTMENT_OTHER)
Admission: EM | Admit: 2021-11-11 | Discharge: 2021-11-11 | Disposition: A | Discharge status: Home / Self Care | Payer: BC Managed Care – PPO | Attending: Emergency Medicine | Admitting: Emergency Medicine

## 2021-11-11 DIAGNOSIS — N3000 Acute cystitis without hematuria: Secondary | ICD-10-CM | POA: Diagnosis not present

## 2021-11-11 DIAGNOSIS — R82998 Other abnormal findings in urine: Secondary | ICD-10-CM | POA: Diagnosis present

## 2021-11-11 DIAGNOSIS — R300 Dysuria: Secondary | ICD-10-CM | POA: Insufficient documentation

## 2021-11-11 LAB — URINALYSIS, ROUTINE W REFLEX MICROSCOPIC
Bilirubin Urine: NEGATIVE
Glucose, UA: NEGATIVE mg/dL
Hgb urine dipstick: NEGATIVE
Ketones, ur: NEGATIVE mg/dL
Nitrite: NEGATIVE
Protein, ur: NEGATIVE mg/dL
Specific Gravity, Urine: 1.021 (ref 1.005–1.030)
pH: 6.5 (ref 5.0–8.0)

## 2021-11-11 LAB — BASIC METABOLIC PANEL
Anion gap: 7 (ref 5–15)
BUN: 11 mg/dL (ref 6–20)
CO2: 28 mmol/L (ref 22–32)
Calcium: 9 mg/dL (ref 8.9–10.3)
Chloride: 104 mmol/L (ref 98–111)
Creatinine, Ser: 0.75 mg/dL (ref 0.44–1.00)
GFR, Estimated: >60 mL/min (ref >60)
Glucose, Bld: 82 mg/dL (ref 70–99)
Potassium: 4.4 mmol/L (ref 3.5–5.1)
Sodium: 139 mmol/L (ref 135–145)

## 2021-11-11 LAB — CBC
HCT: 36.7 % (ref 36.0–46.0)
Hemoglobin: 12 g/dL (ref 12.0–15.0)
MCH: 29.6 pg (ref 26.0–34.0)
MCHC: 32.7 g/dL (ref 30.0–36.0)
MCV: 90.6 fL (ref 80.0–100.0)
Platelets: 267 10*3/uL (ref 150–400)
RBC: 4.05 MIL/uL (ref 3.87–5.11)
RDW: 12.5 % (ref 11.5–15.5)
WBC: 4.9 10*3/uL (ref 4.0–10.5)
nRBC: 0 % (ref 0.0–0.2)

## 2021-11-11 LAB — PREGNANCY, URINE: Preg Test, Ur: NEGATIVE

## 2021-11-11 MED ORDER — CEPHALEXIN 500 MG PO CAPS: 500.0000 mg | ORAL_CAPSULE | Freq: Two times a day (BID) | ORAL | 0 refills | Status: AC

## 2021-11-11 NOTE — Discharge Instructions (Signed)
Your history, exam and work-up today are consistent with a urinary tract infection.  Given your lack of other symptoms or other findings on exam, we agreed together to hold on other labs or work-up.  I do not suspect this is a more systemic infection and we agree with using the antibiotics that have worked for you in the past.  Please rest and stay hydrated and follow-up with your primary doctor.  If any symptoms change or worsen acutely, please return to the nearest emergency department.

## 2021-11-11 NOTE — ED Provider Notes (Signed)
[facility] EMERGENCY DEPT Provider Note   CSN: [id number] Arrival date & time: 11/11/21  [id number]     History  Chief Complaint  Patient presents with   Other    Urinary symptoms     Karina Torres is a 36 y.o. female.  The history is provided by the patient and medical records. No language interpreter was used.  Dysuria Pain quality:  Burning Pain severity:  Mild Onset quality:  Gradual Duration:  1 week Timing:  Intermittent Progression:  Waxing and waning Chronicity:  New Recent urinary tract infections: no (last year)   Relieved by:  Nothing Worsened by:  Nothing Ineffective treatments:  None tried Urinary symptoms: discolored urine and foul-smelling urine   Associated symptoms: no abdominal pain, no fever, no flank pain, no genital lesions, no nausea, no vaginal discharge and no vomiting   Risk factors: not pregnant       Home Medications Prior to Admission medications   Not on File      Allergies    Patient has no known allergies.    Review of Systems   Review of Systems  Constitutional:  Negative for chills, diaphoresis, fatigue and fever.  HENT:  Negative for congestion.   Respiratory:  Negative for cough, chest tightness, shortness of breath and wheezing.   Cardiovascular:  Negative for chest pain and palpitations.  Gastrointestinal:  Negative for abdominal pain, constipation, diarrhea, nausea and vomiting.  Genitourinary:  Positive for dysuria. Negative for flank pain, frequency, hematuria, pelvic pain, urgency, vaginal bleeding, vaginal discharge and vaginal pain.  Musculoskeletal:  Negative for back pain, neck pain and neck stiffness.  Skin:  Negative for rash and wound.  Neurological:  Negative for headaches.  Psychiatric/Behavioral:  Negative for agitation.   All other systems reviewed and are negative.  Physical Exam Updated Vital Signs BP 107/86   Pulse 72   Temp 98.8 F (37.1 C) (Oral)   Resp 20   Ht 5\' 1"  (1.549 m)   Wt  62.6 kg   LMP 10/16/2021   SpO2 100%   BMI 26.07 kg/m  Physical Exam Vitals and nursing note reviewed.  Constitutional:      General: She is not in acute distress.    Appearance: She is well-developed. She is not ill-appearing, toxic-appearing or diaphoretic.  HENT:     Head: Normocephalic and atraumatic.     Nose: No congestion or rhinorrhea.     Mouth/Throat:     Mouth: Mucous membranes are moist.     Pharynx: No oropharyngeal exudate or posterior oropharyngeal erythema.  Eyes:     Extraocular Movements: Extraocular movements intact.     Conjunctiva/sclera: Conjunctivae normal.     Pupils: Pupils are equal, round, and reactive to light.  Cardiovascular:     Rate and Rhythm: Normal rate and regular rhythm.     Heart sounds: No murmur heard. Pulmonary:     Effort: Pulmonary effort is normal. No respiratory distress.     Breath sounds: Normal breath sounds. No wheezing, rhonchi or rales.  Chest:     Chest wall: No tenderness.  Abdominal:     Palpations: Abdomen is soft.     Tenderness: There is no abdominal tenderness. There is no right CVA tenderness, left CVA tenderness, guarding or rebound.  Musculoskeletal:        General: No swelling or tenderness.     Cervical back: Neck supple.  Skin:    General: Skin is warm and dry.  Capillary Refill: Capillary refill takes less than 2 seconds.  Neurological:     Mental Status: She is alert.  Psychiatric:        Mood and Affect: Mood normal.    ED Results / Procedures / Treatments   Labs (all labs ordered are listed, but only abnormal results are displayed) Labs Reviewed  URINALYSIS, ROUTINE W REFLEX MICROSCOPIC - Abnormal; Notable for the following components:      Result Value   APPearance HAZY (*)    Leukocytes,Ua TRACE (*)    Bacteria, UA RARE (*)    All other components within normal limits  PREGNANCY, URINE  CBC  BASIC METABOLIC PANEL    EKG None  Radiology No results found.  Procedures Procedures     Medications Ordered in ED Medications - No data to display  ED Course/ Medical Decision Making/ A&P                           Medical Decision Making Amount and/or Complexity of Data Reviewed Labs: ordered.    Karina Torres is a 36 y.o. female with no significant past medical history who presents with dysuria.  According to patient, she has had urinary tract infections in the past most recently last year.  She says that for the last 2 weeks she has had some foul-smelling urine that had some burning dysuria the other day.  She is concerned she has a UTI.  She is try drinking water but that did not seem to help so presents for evaluation.  She denies any current abdominal pain flank pain, nausea, or vomiting.  Denies fevers or chills.  She denies any vaginal discharge or vaginal bleeding.  She reports she is due for her menstrual cycle soon.  She denies any constipation, diarrhea, or other complaints.  She probably was to make sure there is no UTI.  On exam, lungs clear and chest nontender.  Abdomen nontender.  Flanks and back nontender.  No rashes to suggest shingles.  She denies any pelvic symptoms and we together decided she did not need a pelvic exam at this time.  Patient had no tenderness in her flanks to suggest a pyelonephritis and otherwise is well-appearing.  Patient's urinalysis was interpreted by me and is suggestive of a urinary tract infection given her symptoms, history, and the leukocytes and bacteria with no evidence of contamination.  We reviewed the patient's chart together and I reviewed previous notes indicating that she was given Keflex last time and that seemed to work well for her.  She did not have complications with that and would like to use the same medication.  We will give prescription for Keflex and have her rest and stay hydrated at home.  She agreed with plan of care and was discharged in good condition with antibiotics for urinary tract infection.  She  understands to follow-up with the PCP as well as to return if symptoms worsen.  Patient discharged.        Final Clinical Impression(s) / ED Diagnoses Final diagnoses:  Acute cystitis without hematuria  Dysuria    Clinical Impression: 1. Acute cystitis without hematuria   2. Dysuria     Disposition: Discharge  Condition: Good  I have discussed the results, Dx and Tx plan with the pt(& family if present). He/she/they expressed understanding and agree(s) with the plan. Discharge instructions discussed at great length. Strict return precautions discussed and pt &/or family have verbalized  understanding of the instructions. No further questions at time of discharge.    New Prescriptions   CEPHALEXIN (KEFLEX) 500 MG CAPSULE    Take 1 capsule (500 mg total) by mouth 2 (two) times daily for 7 days.    Follow Up: [place] [phone] Schedule an appointment as soon as possible for a visit    [facility] [place] [phone]        Jaquayla Hege, Gwenyth Allegra, MD 11/11/21 [phone]

## 2021-11-11 NOTE — ED Triage Notes (Signed)
Pt c/o of uti symptoms for a couple weeks but worse today. States dysuria started yesterday. C/o flank pain. Denies fevers. Denies any n/v. LMP October 16, 2021

## 2021-12-18 ENCOUNTER — Ambulatory Visit (INDEPENDENT_AMBULATORY_CARE_PROVIDER_SITE_OTHER): Payer: BC Managed Care – PPO | Admitting: Family Medicine

## 2021-12-18 ENCOUNTER — Other Ambulatory Visit: Payer: Self-pay

## 2021-12-18 VITALS — BP 109/82 | HR 77 | Wt 135.8 lb

## 2021-12-18 DIAGNOSIS — Z3200 Encounter for pregnancy test, result unknown: Secondary | ICD-10-CM

## 2021-12-18 DIAGNOSIS — Z3201 Encounter for pregnancy test, result positive: Secondary | ICD-10-CM | POA: Diagnosis not present

## 2021-12-18 DIAGNOSIS — O09299 Supervision of pregnancy with other poor reproductive or obstetric history, unspecified trimester: Secondary | ICD-10-CM | POA: Diagnosis not present

## 2021-12-18 DIAGNOSIS — Z98891 History of uterine scar from previous surgery: Secondary | ICD-10-CM

## 2021-12-18 DIAGNOSIS — Z8759 Personal history of other complications of pregnancy, childbirth and the puerperium: Secondary | ICD-10-CM | POA: Insufficient documentation

## 2021-12-18 LAB — POCT PREGNANCY, URINE: Preg Test, Ur: POSITIVE — AB

## 2022-01-29 ENCOUNTER — Telehealth (INDEPENDENT_AMBULATORY_CARE_PROVIDER_SITE_OTHER): Payer: BC Managed Care – PPO

## 2022-01-29 DIAGNOSIS — O0990 Supervision of high risk pregnancy, unspecified, unspecified trimester: Secondary | ICD-10-CM | POA: Insufficient documentation

## 2022-01-29 DIAGNOSIS — Z3A00 Weeks of gestation of pregnancy not specified: Secondary | ICD-10-CM

## 2022-01-29 DIAGNOSIS — O09529 Supervision of elderly multigravida, unspecified trimester: Secondary | ICD-10-CM

## 2022-01-29 NOTE — Progress Notes (Signed)
New OB Intake  I connected with  Karina Torres on 01/29/22 at  8:15 AM EDT by MyChart Video Visit and verified that I am speaking with the correct person using two identifiers. Nurse is located at [facility] and pt is located at home.  I discussed the limitations, risks, security and privacy concerns of performing an evaluation and management service by telephone and the availability of in person appointments. I also discussed with the patient that there may be a patient responsible charge related to this service. The patient expressed understanding and agreed to proceed.  I explained I am completing New OB Intake today. We discussed her EDD of 08/22/2022 that is based on LMP of 11/15/2021. Pt is G3/P1102. I reviewed her allergies, medications, Medical/Surgical/OB history, and appropriate screenings. I informed her of [facility] services. [facility] information placed in AVS. Based on history, this is a/an  pregnancy complicated by advanced maternal age  .   Patient Active Problem List   Diagnosis Date Noted   Supervision of high risk pregnancy, antepartum 01/29/2022   History of HELLP syndrome, currently pregnant 12/18/2021   History of C-section 12/18/2021    Concerns addressed today  Delivery Plans Plans to deliver at [facility]. Patient given information for [facility] Healthy Baby website for more information about [facility]. Patient is interested in water birth. Offered upcoming OB visit with CNM to discuss further.  MyChart/Babyscripts MyChart access verified. I explained pt will have some visits in office and some virtually. Babyscripts instructions given and order placed. Patient verifies receipt of registration text/e-mail. Account successfully created and app downloaded.  Blood Pressure Cuff/Weight Scale Patient has private insurance; instructed to purchase blood pressure cuff and bring to first prenatal appt. Explained after first prenatal appt pt will check weekly and document in  Babyscripts.  Anatomy US Explained first scheduled [place] will be around 19 weeks. Anatomy US scheduled for 04/01/2022 at 09:30AM. Pt notified to arrive at 09:15AM.  Labs Discussed Natera genetic screening with patient. Would like both Panorama and Horizon drawn at new OB visit. Routine prenatal labs needed.  Covid Vaccine Patient has covid vaccine.   Is patient a CenteringPregnancy candidate?  Declined Declined due to Schedule/Times  Is patient a Mom+Baby Combined Care candidate?  Not a candidate     Social Determinants of Health Food Insecurity: Patient expresses food insecurity. Food Market information given to patient; explained patient may visit at the end of first OB appointment. WIC Referral: Patient is interested in referral to [facility].  Transportation: Patient denies transportation needs. Childcare: Discussed no children allowed at ultrasound appointments. Offered childcare services; patient declines childcare services at this time.  First visit review I reviewed new OB appt with pt. I explained she will have a provider visit that includes genetic screening, new ob labs, and pap smear. Explained pt will be seen by Vonzella Nipple PA-C at first visit; encounter routed to appropriate provider. Explained that patient will be seen by pregnancy navigator following visit with provider.   Vidal Schwalbe, [place] 01/29/2022  8:40 AM

## 2022-02-05 ENCOUNTER — Other Ambulatory Visit (HOSPITAL_COMMUNITY)
Admission: RE | Admit: 2022-02-05 | Discharge: 2022-02-05 | Disposition: A | Discharge status: Home / Self Care | Payer: BC Managed Care – PPO | Source: Ambulatory Visit | Attending: Medical | Admitting: Medical

## 2022-02-05 ENCOUNTER — Other Ambulatory Visit: Payer: Self-pay

## 2022-02-05 ENCOUNTER — Ambulatory Visit (INDEPENDENT_AMBULATORY_CARE_PROVIDER_SITE_OTHER): Payer: BC Managed Care – PPO | Admitting: Medical

## 2022-02-05 ENCOUNTER — Encounter: Payer: Self-pay | Admitting: Medical

## 2022-02-05 VITALS — BP 112/79 | HR 80 | Wt 148.0 lb

## 2022-02-05 DIAGNOSIS — O09521 Supervision of elderly multigravida, first trimester: Secondary | ICD-10-CM

## 2022-02-05 DIAGNOSIS — O09291 Supervision of pregnancy with other poor reproductive or obstetric history, first trimester: Secondary | ICD-10-CM

## 2022-02-05 DIAGNOSIS — O0991 Supervision of high risk pregnancy, unspecified, first trimester: Secondary | ICD-10-CM

## 2022-02-05 DIAGNOSIS — O09891 Supervision of other high risk pregnancies, first trimester: Secondary | ICD-10-CM

## 2022-02-05 DIAGNOSIS — O09299 Supervision of pregnancy with other poor reproductive or obstetric history, unspecified trimester: Secondary | ICD-10-CM

## 2022-02-05 DIAGNOSIS — O09529 Supervision of elderly multigravida, unspecified trimester: Secondary | ICD-10-CM

## 2022-02-05 DIAGNOSIS — Z3A11 11 weeks gestation of pregnancy: Secondary | ICD-10-CM

## 2022-02-05 DIAGNOSIS — O0990 Supervision of high risk pregnancy, unspecified, unspecified trimester: Secondary | ICD-10-CM

## 2022-02-05 DIAGNOSIS — Z124 Encounter for screening for malignant neoplasm of cervix: Secondary | ICD-10-CM | POA: Diagnosis present

## 2022-02-05 DIAGNOSIS — K2900 Acute gastritis without bleeding: Secondary | ICD-10-CM

## 2022-02-05 DIAGNOSIS — Z98891 History of uterine scar from previous surgery: Secondary | ICD-10-CM

## 2022-02-05 MED ORDER — BLOOD PRESSURE KIT DEVI: 1.0000 | 0 refills | Status: DC

## 2022-02-05 MED ORDER — PANTOPRAZOLE SODIUM 20 MG PO TBEC: 20.0000 mg | DELAYED_RELEASE_TABLET | Freq: Every day | ORAL | 3 refills | Status: DC

## 2022-02-05 NOTE — Patient Instructions (Addendum)
Safe Medications in Pregnancy   Acne: Benzoyl Peroxide Salicylic Acid  Backache/Headache: Tylenol: 2 regular strength every 4 hours OR              2 Extra strength every 6 hours  Colds/Coughs/Allergies: Benadryl (alcohol free) 25 mg every 6 hours as needed Breath right strips Claritin Cepacol throat lozenges Chloraseptic throat spray Cold-Eeze- up to three times per day Cough drops, alcohol free Flonase (by prescription only) Guaifenesin Mucinex Robitussin DM (plain only, alcohol free) Saline nasal spray/drops Sudafed (pseudoephedrine) & Actifed ** use only after [redacted] weeks gestation and if you do not have high blood pressure Tylenol Vicks Vaporub Zinc lozenges Zyrtec   Constipation: Colace Ducolax suppositories Fleet enema Glycerin suppositories Metamucil Milk of magnesia Miralax Senokot Smooth move tea  Diarrhea: Kaopectate Imodium A-D  *NO pepto Bismol  Hemorrhoids: Anusol Anusol HC Preparation H Tucks  Indigestion: Tums Maalox Mylanta Zantac  Pepcid  Insomnia: Benadryl (alcohol free) 25mg every 6 hours as needed Tylenol PM Unisom, no Gelcaps  Leg Cramps: Tums MagGel  Nausea/Vomiting:  Bonine Dramamine Emetrol Ginger extract Sea bands Meclizine  Nausea medication to take during pregnancy:  Unisom (doxylamine succinate 25 mg tablets) Take one tablet daily at bedtime. If symptoms are not adequately controlled, the dose can be increased to a maximum recommended dose of two tablets daily (1/2 tablet in the morning, 1/2 tablet mid-afternoon and one at bedtime). Vitamin B6 100mg tablets. Take one tablet twice a day (up to 200 mg per day).  Skin Rashes: Aveeno products Benadryl cream or 25mg every 6 hours as needed Calamine Lotion 1% cortisone cream  Yeast infection: Gyne-lotrimin 7 Monistat 7   **If taking multiple medications, please check labels to avoid duplicating the same active ingredients **take medication as directed on  the label ** Do not exceed 4000 mg of tylenol in 24 hours **Do not take medications that contain aspirin or ibuprofen      AREA PEDIATRIC/FAMILY PRACTICE PHYSICIANS  Central/[place]outheast [place] ([address]) [facility] Chambliss, MD; Eniola, MD; Hale, MD; Hensel, MD; McDiarmid, MD; McIntyer, MD; Neal, MD; Walden, MD [address] [place], [place] [address] [phone] Mon-Fri 8:30-12:30, 1:30-5:00 Providers come to see babies at [facility] Accepting Medicaid [facility] providers who accept newborns: Koirala, MD; Morrow, MD; Wolters, MD [address], [place], [place] [address] [phone] Mon-Fri 8:00-5:30 Babies seen by providers at [facility] Does NOT accept Medicaid Please call early in hospitalization for appointment (limited availability)  [facility] Mulberry, MD [address] [place], [place] [address] [phone] Mon, Tue, Thur, Fri 8:30-5:00, Wed 10:00-7:00 (closed 1-2pm) Babies seen by [facility] providers Accepting Medicaid Rubin - Pediatrician Rubin, MD [address], [place], [place] [address] [phone] Mon-Fri 8:30-5:00, Sat 8:30-12:00 Provider comes to see babies at [facility] Accepting Medicaid Must have been referred from current patients or contacted office prior to delivery [facility] ([facility]) Brown, MD; Chandler, MD; Ettefagh, MD; Grant, MD; Lester, MD; McCormick, MD; McQueen, MD; Prose, MD; Simha, MD; Stanley, MD; Stryffeler, NP; Tebben, NP [address], [place], [place] [address] [phone] Mon, Tue, Thur, Fri 8:30-5:30, Wed 9:30-5:30, Sat 8:30-12:30 Babies seen by [facility] providers Accepting Medicaid Only accepting infants of first-time parents or siblings of current patients Hospital discharge coordinator will make  follow-up appointment Jack Amos [address], [place], [place]  [address] [phone]     Fax - [phone] [facility] [address], [place], [place]  [address] Phone - [phone]   Fax - [phone] Shilpa Gosrani [address], [place], [place]  [address] [phone]  [place]ortheast [place] ([address]) [facility] Bates, MD; Brassfield, MD; Cooper, Cox, MD; MD; Davis, MD; Dovico, MD; Ettefaugh, MD; Little, MD; Lowe, MD; Keiffer, MD; Melvin, MD; Sumner, MD; Williams, MD [address], [place], [place] [address] [phone] Mon-Fri 8:30-5:00 (extended evenings Mon-Thur as needed), Sat-Sun 10:00-1:00 Providers come to see babies at [facility] Accepting Medicaid for families of first-time babies and families with all children in the household age 3 and under. Must register with office prior to making appointment (M-F only). [facility] Henson, NP; Knapp, MD; Lalonde, MD; Tysinger, PA [address] [place], [place] [address] [phone] Mon-Fri 8:00-5:00 Babies seen by providers at [facility] Does NOT accept Medicaid/Commercial Insurance Only [facility] ([facility])  Artis, MD; Barnes, MD; Bratton, MD; Coccaro, MD; Lockett Gardner, MD; Kramer, MD; Marshall, MD; Netherton, MD; Poleto, MD; Skinner, MD [address] [place], [place] [address] [phone] Mon-Fri 8:30-5:30, Sat (Oct.-Mar.) 9:00-1:00 Babies seen by providers at [facility] Accepting Medicaid  [place] ([address]) [facility] Reid, MD; Warner, MD [address], [place], [place] [address] [phone] Mon-Fri 8:30-5:00, Sat 8:30-12:00 Providers come to see babies at [facility] Does NOT accept Medicaid [facility] Becker, PA; Hagler, MD; Scifres, PA; Sun, MD; Swayne, MD [address], [place], [place]  [address] [phone] Mon-Fri 8:00-5:00 Babies seen by providers at [facility] Does NOT accept Medicaid Only accepting babies of parents who are patients Please call early in hospitalization for appointment (limited availability) [facility] Clark, MD; Frye, MD; Kelleher, MD; Mack, NP; Miller, MD; O'Keller, MD; Patterson, NP; Pudlo, MD; Puzio, MD; Thomas, MD; Tucker, MD; Twiselton, MD [address], [place], [place] [address] [phone] Mon-Fri 8:00-5:00, Sat 9:00-12:00 Providers come to see babies at [facility] Does NOT accept Medicaid  [place] ([address]) [facility] providers accepting new patients: Brake, NP; Wharton, PA [address], [place], [place] [address] [phone] Mon-Fri 8:00-5:00 Babies seen by providers at [facility] Does NOT accept Medicaid Only accepting babies of parents who are patients Please call early in hospitalization for appointment (limited availability) [facility] Gay, MD; Quinlan, MD [address] [place], [place] [address] [phone] (press 1 to schedule appointment) Mon-Fri 8:00-5:00 Providers come to see babies at [facility] Does NOT accept Medicaid [facility] Mazer, MD [address] [place], [place] [address] [phone] Mon-Fri 8:30-5:00 (lunch 12:30-1:00), extended hours by appointment only Wed 5:00-6:30 Babies seen by [facility] providers Accepting Medicaid [facility] Banks, MD; Jordan, MD; Koberlein, MD [address], [place], [place] [address] [phone] Mon-Fri 8:00-5:00 Babies seen by [facility] providers Does NOT accept Medicaid [facility] Parker, MD; Hunter, MD; Wallace, DO [address] [place], [place] [address] [phone] Mon-Fri 8:00-5:00 Babies seen by [facility] providers Does NOT accept Medicaid [facility] Brandon, PA; Brecken, PA; Christy, NP; Dees, MD; DeClaire, MD; DeWeese, MD; Hansen, NP; Mills, NP; Parrish, NP; Smoot, NP; Summer, MD; Vapne, MD [address] ,  [address] [phone] Mon-Fri 8:30-5:00, Sat 10:00-1:00 Providers come to see babies at [facility] Does NOT accept Medicaid Free prenatal information session Tuesdays at 4:45pm [facility] Bouska, MD; Gordon, PA; Jeffery, PA; Weber, PA [address] [place] [address] [place] [address] [phone] Mon-Fri 7:30-5:30 Babies seen by [facility] providers [facility] [address], [place], [place]  [address] [phone]   Fax - [phone]  [place] ([address] & [address]) [facility] [place] [address] [place], [place] [address] [phone] Mon-Thur 8:00-6:00 Providers come to see babies at [facility] Accepting Medicaid [facility] Anderson, NP; Badger, MD; Beal, PA; Spencer, PA [address] [place], [place] [address] [phone] Mon-Thur 7:30-7:30, Fri 7:30-4:30 Babies seen by [facility] providers Accepting Medicaid [facility] Agbuya, MD; Klett, NP; Romgoolam, MD [address], [place], [place] [address] [phone] Mon-Fri 8:30-5:00, Sat 8:30-12:00 Providers come to see babies at [facility] Accepting Medicaid Must have "Meet & Greet" appointment at office prior to delivery [facility] McCord, MD; Wallace, MD; Wood, MD [address], [place], [place] [address] [phone] Mon-Wed 8:00-6:00, Thur-Fri 8:00-5:00, Sat 9:00-12:00 Providers come to see babies at [facility] Does NOT accept Medicaid Only accepting siblings of current patients [facility]  [address], [place], [place]  [address] [phone]   Fax  - [phone] [facility] [address], [place], [place]  [address] [phone]   Fax - [phone]  [place] ([address] & [address]) [facility] Cirigliano, DO; Matthews, DO [address] [place], [place] [address] [phone] Mon-Fri 7:00-5:00 Babies seen by [facility] providers Does NOT accept Medicaid [facility] Briscoe, MD; Howley, PA; Moreira, PA [address], [place], [place] [address] [phone] Mon-Fri 8:00-5:00 Babies seen by [facility] providers Accepting Medicaid [facility] Boyd, MD; Church, PA; Jones, NP; Osborn, PA [address], [place], [place] [address] [phone] Mon-Fri 8:00-5:00 Babies seen by providers at [facility] Accepting Medicaid  [place] ([address]) [facility] [place] Wendling, DO [address] [place], [place] [address] [phone] Mon-Fri 8:00-5:00 Babies seen by [facility] providers Does NOT accept Medicaid Limited availability, please call early in hospitalization to schedule follow-up [facility] Calderon, PA; Cummings, MD; Dillard, MD; Martin, PA; Olson, MD; VanDeven, PA [address], [place], [place] [address] [phone] Mon-Fri 8:30-5:00, Sat 9:00-12:00 Babies seen by providers at [facility] Accepting Medicaid Please register online then schedule online or call office [email or web address] Hunter, NP; Kumar, MD; Martin Rogers, PA [address], [place], [place] [address] [phone] Mon-Fri 8:00-5:00 Babies seen by providers at [facility] Accepting Medicaid [facility] ([facility]) [place], MD; Kristi Fleenor, NP; West, MD [address], [place], [place] [address] [phone] Mon-Fri 8:00-5:30, Sat&Sun by appointment (phones open at 8:30) Babies seen by [facility] providers Accepting Medicaid Must be a first-time baby or sibling of current patient [facility] [place]  [address], [place], [place]  [address] [phone]   Fax - [phone]  [place] ([address]) [facility] Brown, PA; Cowen, PA; Rice, MD; Helton, PA; Spry, MD [address] [place], [place] [address] [phone] Mon-Thur 8:00-7:00, Fri 8:00-5:00, Sat 8:00-12:00, Sun 9:00-12:00 Babies seen by [facility] providers Accepting Medicaid [facility] [place] Coe-Goins, MD; Marshall, MD; Pierre-Louis, MD [address], [place], [place] [address] [phone] Mon-Thur 8:00-5:00 Babies seen by providers at [facility] Accepting Medicaid [facility] Bratton, MD; Coe-Goins, MD; Hayes, MD; Lewis, MD; List, MD; Lott, MD; Marshall, MD; Moran, MD; O'Neal, MD; Pierre-Louis, MD; Pitonzo, MD; Scholer, MD; Spangle, MD [address] [place], [place] [address] [phone] Mon-Fri 8:00-5:30, Sat (Oct.-Mar.) 9:00-1:00 Babies seen by providers at [facility] Accepting Medicaid Must fill out new patient packet, available online at [email or web address] [facility] ([facility]) Friddle, NP; Harris, NP; Kelly, NP; Logan, MD; Melvin, PA; Poth, MD; Ramadoss, MD; Stanton, NP [address], [place], [place] [address] [phone] Mon-Thur 8:00-5:30, Fri 8:00-5:00 Babies seen by providers at [facility] Accepting Medicaid  [place] ([address]) [facility] Dixon, PA; Newhalen, MD; Pickard, MD; Tapia, PA [address], [place], [place] [address] [phone] Mon-Fri  8:00-5:00 Babies seen by providers at [facility] Accepting Medicaid   [place] ([address]) [facility] [place] Masneri, DO; Meyers, MD; Nelson, PA [address], [place], [place] [address] [phone] Mon-Fri 8:00-5:00 Babies seen by providers at [facility] Does NOT accept Medicaid Limited appointment availability, please call early in hospitalization  [facility] [place] Kunedd, DO; McGowen, MD [address], [place], [place] [address] [phone] Mon-Fri 8:00-5:00 Babies seen by [facility] providers Does NOT accept Medicaid [facility] [place] Cameron, MD; MacDonald, MD; Michaels, PA; Nayak, MD [address] [place] [address], [place], [place] [address] [phone] Mon-Fri 8:00-5:00 After hours clinic ([address] [place], [place] [address]) [phone] Mon-Fri 5:00-8:00, Sat 12:00-6:00, Sun 10:00-4:00 Babies seen by [facility] providers Accepting Medicaid [facility] [place] [address], [place], [place]  [address] [phone]   Fax - [phone]  [place] ([address]) [facility] [place] [facility] Andy, MD [address], [place], [place] [address] [phone] Mon-Fri 8:00-5:00 Babies seen by [facility] providers Does NOT accept Medicaid [facility] [place] ([facility] [place]) Eksir, MD [address], [place], [place] [address] [phone] Mon-Thur 8:00-7:00, Fri 8:00-5:00, Sat 8:00-12:00 Babies seen by providers at [facility] Accepting Medicaid - but does not have vaccinations in office (must be received elsewhere) Limited availability, please call early in hospitalization  [place] ([address]) [facility]  Charlene Flemming, MD [address], [place] [address] [phone]  Fax [phone]  [facility]   Kimberly Newton, MD, Annamarie Streilein, PA, Carla Hampton, PA [address] [place], [place] [address] [phone] [facility]  [address], [place], [place] [address] [phone] [address], [place], [place] [address] [phone] (West Office)  [facility] [address], [place], [place] [address] [phone] [facility] [address], [place], [place] [address] [phone] [facility] [address], [place], [place] [address] [phone] [facility] [address], [place], [place] [address] [phone] [facility] [address], [place], [place] [address] [phone] [facility] [address], [place], [place] [address] [phone] [facility]  [address], [place], [place] [address] [phone] Dr. [facility] [address] [place] [address], [place],  [address] [phone] [facility] [address], [place], [place] [address] [phone] [facility] [address], [place], [place] [address] [phone]  

## 2022-02-05 NOTE — Progress Notes (Unsigned)
Patient reports abdominal pain that would happen after a meal. She described it as "being bloated". Patient does have hx of HELLP syndrome and BP cuff was ordered and sent to [facility]. Pt verbalized understanding

## 2022-02-06 LAB — CBC/D/PLT+RPR+RH+ABO+RUBIGG...
Antibody Screen: NEGATIVE
Basophils Absolute: 0 10*3/uL (ref 0.0–0.2)
Basos: 0 %
EOS (ABSOLUTE): 0.1 10*3/uL (ref 0.0–0.4)
Eos: 1 %
HCV Ab: NONREACTIVE
HIV Screen 4th Generation wRfx: NONREACTIVE
Hematocrit: 38.4 % (ref 34.0–46.6)
Hemoglobin: 13.3 g/dL (ref 11.1–15.9)
Hepatitis B Surface Ag: NEGATIVE
Immature Grans (Abs): 0 10*3/uL (ref 0.0–0.1)
Immature Granulocytes: 0 %
Lymphocytes Absolute: 2.1 10*3/uL (ref 0.7–3.1)
Lymphs: 20 %
MCH: 30.4 pg (ref 26.6–33.0)
MCHC: 34.6 g/dL (ref 31.5–35.7)
MCV: 88 fL (ref 79–97)
Monocytes Absolute: 0.7 10*3/uL (ref 0.1–0.9)
Monocytes: 7 %
Neutrophils Absolute: 7.3 10*3/uL — ABNORMAL HIGH (ref 1.4–7.0)
Neutrophils: 72 %
Platelets: 293 10*3/uL (ref 150–450)
RBC: 4.38 x10E6/uL (ref 3.77–5.28)
RDW: 12 % (ref 11.7–15.4)
RPR Ser Ql: NONREACTIVE
Rh Factor: POSITIVE
Rubella Antibodies, IGG: 3.28 index (ref >0.99)
WBC: 10.3 10*3/uL (ref 3.4–10.8)

## 2022-02-06 LAB — COMPREHENSIVE METABOLIC PANEL
ALT: 15 IU/L (ref 0–32)
AST: 13 IU/L (ref 0–40)
Albumin/Globulin Ratio: 1.6 (ref 1.2–2.2)
Albumin: 4.1 g/dL (ref 3.9–4.9)
Alkaline Phosphatase: 49 IU/L (ref 44–121)
BUN/Creatinine Ratio: 9 (ref 9–23)
BUN: 6 mg/dL (ref 6–20)
Bilirubin Total: 0.3 mg/dL (ref 0.0–1.2)
CO2: 22 mmol/L (ref 20–29)
Calcium: 9.2 mg/dL (ref 8.7–10.2)
Chloride: 98 mmol/L (ref 96–106)
Creatinine, Ser: 0.65 mg/dL (ref 0.57–1.00)
Globulin, Total: 2.6 g/dL (ref 1.5–4.5)
Glucose: 75 mg/dL (ref 70–99)
Potassium: 4.4 mmol/L (ref 3.5–5.2)
Sodium: 134 mmol/L (ref 134–144)
Total Protein: 6.7 g/dL (ref 6.0–8.5)
eGFR: 117 mL/min/{1.73_m2} (ref >59)

## 2022-02-06 LAB — HEMOGLOBIN A1C
Est. average glucose Bld gHb Est-mCnc: 105 mg/dL
Hgb A1c MFr Bld: 5.3 % (ref 4.8–5.6)

## 2022-02-06 LAB — PROTEIN / CREATININE RATIO, URINE
Creatinine, Urine: 199.7 mg/dL
Protein, Ur: 13.6 mg/dL
Protein/Creat Ratio: 68 mg/g creat (ref 0–200)

## 2022-02-06 LAB — HCV INTERPRETATION

## 2022-02-06 LAB — TSH: TSH: 1.27 u[IU]/mL (ref 0.450–4.500)

## 2022-02-06 MED ORDER — ASPIRIN 81 MG PO TBEC: 81.0000 mg | DELAYED_RELEASE_TABLET | Freq: Every day | ORAL | 12 refills | Status: DC

## 2022-02-06 NOTE — Progress Notes (Signed)
PRENATAL VISIT NOTE  Subjective:  Karina Torres is a 36 y.o. [id number] at 21w6dbeing seen today for her first prenatal visit for this pregnancy.  She is currently monitored for the following issues for this high-risk pregnancy and has History of HELLP syndrome, currently pregnant; History of C-section; Supervision of high risk pregnancy, antepartum; AMA (advanced maternal age) multigravida 35+; and History of preterm delivery, currently pregnant in first trimester on their problem list.  Patient reports heartburn.  Contractions: Not present. Vag. Bleeding: None.  Movement: Absent. Denies leaking of fluid.   She is planning to bottle feed. Desires contraception, undecided on method.  The following portions of the patient's history were reviewed and updated as appropriate: allergies, current medications, past family history, past medical history, past social history, past surgical history and problem list.   Objective:   Vitals:   02/05/22 0958  BP: 112/79  Pulse: 80  Weight: 148 lb (67.1 kg)    Fetal Status: Fetal Heart Rate (bpm): 159   Movement: Absent     General:  Alert, oriented and cooperative. Patient is in no acute distress.  Skin: Skin is warm and dry. No rash noted.   Cardiovascular: Normal heart rate and rhythm noted  Respiratory: Normal respiratory effort, no problems with respiration noted. Clear to auscultation.   Abdomen: Soft, gravid, appropriate for gestational age. Normal bowel sounds. Non-tender. Pain/Pressure: Present     Pelvic: Cervical exam performed       closed, thick, firm  Normal cervical contour, no lesions, no bleeding following pap, normal discharge  Extremities: Normal range of motion.  Edema: None  Mental Status: Normal mood and affect. Normal behavior. Normal judgment and thought content.    Indications for ASA therapy (per uptodate) One of the following: Previous pregnancy with preeclampsia, especially early onset and with an adverse outcome  Yes Multifetal gestation No Chronic hypertension No Type 1 or 2 diabetes mellitus No Chronic kidney disease No Autoimmune disease (antiphospholipid syndrome, systemic lupus erythematosus) No  Indications for early GDM screening  First-degree relative with diabetes - unknown  BMI >30kg/m2 No Age > 377Yes Previous birth of an infant weighing ?4000 g No Gestational diabetes mellitus in a previous pregnancy No Glycated hemoglobin ?5.7 percent (39 mmol/mol), impaired glucose tolerance, or impaired fasting glucose on previous testing No High-risk race/ethnicity (eg, African American, Latino, Native American, [place], Pacific Islander) Yes Previous stillbirth of unknown cause No Maternal birthweight > 9 lbs No History of cardiovascular disease No Hypertension or on therapy for hypertension No High-density lipoprotein cholesterol level <35 mg/dL (0.90 mmol/L) and/or a triglyceride level >250 mg/dL (2.82 mmol/L) No Polycystic ovary syndrome No Physical inactivity No Other clinical condition associated with insulin resistance (eg, severe obesity, acanthosis nigricans) No Current use of glucocorticoids No   Assessment and Plan:  Pregnancy: G3P1102 at 148w6d. Antepartum multigravida of advanced maternal age - < 4062o, no change in management   2. History of HELLP syndrome, currently pregnant - Baseline labs today, normotensive today  - Protein / creatinine ratio, urine - Comprehensive metabolic panel - TSH - Blood Pressure Monitoring (BLOOD PRESSURE KIT) DEVI; 1 each by Does not apply route once a week.  Dispense: 1 each; Refill: 0  3. Supervision of high risk pregnancy, antepartum - CBC/D/Plt+RPR+Rh+ABO+RubIgG... - Hemoglobin A1c - HORIZON Custom - Panorama Prenatal Test Full Panel - Protein / creatinine ratio, urine - Culture, OB Urine - Comprehensive metabolic panel - TSH - Blood Pressure Monitoring (BLOOD PRESSURE KIT)  DEVI; 1 each by Does not apply route once a week.   Dispense: 1 each; Refill: 0  4. Cervical cancer screening - Cytology - PAP( [place])  5. History of C-section -- Also previous VBAC - Desires VBAC with this delivery   6. [redacted] weeks gestation of pregnancy  7. History of preterm delivery, currently pregnant in first trimester - IOL due to Pre-eclampsia   8. Other acute gastritis without hemorrhage - pantoprazole (PROTONIX) 20 MG tablet; Take 1 tablet (20 mg total) by mouth daily.  Dispense: 30 tablet; Refill: 3  Preterm labor/first trimester warning symptoms and general obstetric precautions including but not limited to vaginal bleeding, contractions, leaking of fluid and fetal movement were reviewed in detail with the patient. Please refer to After Visit Summary for other counseling recommendations.   Discussed the normal visit cadence for prenatal care Discussed the nature of our practice with multiple providers including residents and students   Return in about 4 weeks (around 03/05/2022) for LOB, In-Person, any provider.  Future Appointments  Date Time Provider [place]  03/07/2022 10:15 AM Minette Brine [facility]  04/01/2022  9:15 AM [facility] NURSE [facility]  04/01/2022  9:30 AM [facility] US2 [facility]    Kerry Hough, PA-C

## 2022-02-06 NOTE — Addendum Note (Signed)
Addended by: Marny Lowenstein on: 02/06/2022 03:26 PM   Modules accepted: Orders

## 2022-02-07 LAB — CULTURE, OB URINE

## 2022-02-07 LAB — URINE CULTURE, OB REFLEX: Organism ID, Bacteria: NO GROWTH

## 2022-02-11 LAB — PANORAMA PRENATAL TEST FULL PANEL:PANORAMA TEST PLUS 5 ADDITIONAL MICRODELETIONS: FETAL FRACTION: 9.6

## 2022-02-11 LAB — CYTOLOGY - PAP
Chlamydia: NEGATIVE
Comment: NEGATIVE
Comment: NEGATIVE
Comment: NORMAL
Diagnosis: NEGATIVE
High risk HPV: NEGATIVE
Neisseria Gonorrhea: NEGATIVE

## 2022-02-15 ENCOUNTER — Encounter: Payer: Self-pay | Admitting: Medical

## 2022-02-15 DIAGNOSIS — R898 Other abnormal findings in specimens from other organs, systems and tissues: Secondary | ICD-10-CM | POA: Insufficient documentation

## 2022-02-15 LAB — HORIZON CUSTOM: REPORT SUMMARY: POSITIVE — AB

## 2022-03-01 ENCOUNTER — Other Ambulatory Visit: Payer: Self-pay

## 2022-03-01 ENCOUNTER — Inpatient Hospital Stay (HOSPITAL_COMMUNITY)
Admission: AD | Admit: 2022-03-01 | Discharge: 2022-03-01 | Disposition: A | Discharge status: Home / Self Care | Payer: BC Managed Care – PPO | Attending: Obstetrics & Gynecology | Admitting: Obstetrics & Gynecology

## 2022-03-01 ENCOUNTER — Encounter (HOSPITAL_COMMUNITY): Payer: Self-pay | Admitting: Obstetrics & Gynecology

## 2022-03-01 DIAGNOSIS — R519 Headache, unspecified: Secondary | ICD-10-CM

## 2022-03-01 DIAGNOSIS — O09891 Supervision of other high risk pregnancies, first trimester: Secondary | ICD-10-CM

## 2022-03-01 DIAGNOSIS — R309 Painful micturition, unspecified: Secondary | ICD-10-CM | POA: Insufficient documentation

## 2022-03-01 DIAGNOSIS — Z3A15 15 weeks gestation of pregnancy: Secondary | ICD-10-CM | POA: Diagnosis not present

## 2022-03-01 DIAGNOSIS — O26892 Other specified pregnancy related conditions, second trimester: Secondary | ICD-10-CM

## 2022-03-01 DIAGNOSIS — O0990 Supervision of high risk pregnancy, unspecified, unspecified trimester: Secondary | ICD-10-CM

## 2022-03-01 LAB — URINALYSIS, ROUTINE W REFLEX MICROSCOPIC
Bilirubin Urine: NEGATIVE
Glucose, UA: NEGATIVE mg/dL
Hgb urine dipstick: NEGATIVE
Ketones, ur: 20 mg/dL — AB
Leukocytes,Ua: NEGATIVE
Nitrite: NEGATIVE
Protein, ur: NEGATIVE mg/dL
Specific Gravity, Urine: 1.012 (ref 1.005–1.030)
pH: 6 (ref 5.0–8.0)

## 2022-03-01 MED ORDER — EXCEDRIN TENSION HEADACHE 500-65 MG PO TABS
2.0000 | ORAL_TABLET | Freq: Four times a day (QID) | ORAL | 0 refills | Status: DC
Start: 2022-03-01 — End: 2022-08-17

## 2022-03-01 MED ORDER — ASPIRIN-ACETAMINOPHEN-CAFFEINE 250-250-65 MG PO TABS: 2.0000 | ORAL_TABLET | Freq: Once | ORAL | Status: DC

## 2022-03-01 MED ORDER — ACETAMINOPHEN-CAFFEINE 500-65 MG PO TABS
2.0000 | ORAL_TABLET | Freq: Once | ORAL | Status: AC
  Administered 2022-03-01: 2 via ORAL
  Filled 2022-03-01: qty 2

## 2022-03-01 MED ORDER — LACTATED RINGERS IV BOLUS
1000.0000 mL | Freq: Once | INTRAVENOUS | Status: AC
  Administered 2022-03-01: 1000 mL via INTRAVENOUS

## 2022-03-01 MED ORDER — SODIUM CHLORIDE 0.9 % IV SOLN: 25.0000 mg | Freq: Once | INTRAVENOUS | Status: DC

## 2022-03-01 MED ORDER — METOCLOPRAMIDE HCL 5 MG/ML IJ SOLN: 10.0000 mg | Freq: Once | INTRAMUSCULAR | Status: DC

## 2022-03-01 NOTE — MAU Provider Note (Cosign Needed Addendum)
History     CSN: [id number]  Arrival date and time: 03/01/22 6629   Event Date/Time   First Provider Initiated Contact with Patient 03/01/22 [phone]      Chief Complaint  Patient presents with   Headache   Karina Torres is a 36 y.o. G3P1102 at 89w1dwho [facility]  She presents today for Headache.  She states she has been experiencing a headache since "the day before yesterday" that has been unrelieved with tylenol dosing.  Patient reports the pain is located in her right side and is throbbing in nature.  Patient reports the headache also causes discomfort in her neck, the back of her head, and behind her right eye.  She states the pain has no relieving factors, but Paul, the FOB, reports her headache is worse with standing up. She rates the headache a 5/10 and also reports some nausea. She reports a history of migraines that where relieved with tylenol and Excedrin. She does report some discomfort and pain with urination. No vaginal bleeding or discharge that is of concern.     OB History     Gravida  3   Para  2   Term  1   Preterm  1   AB      Living  2      SAB      IAB      Ectopic      Multiple  0   Live Births  2           Past Medical History:  Diagnosis Date   Hx of chlamydia infection    Hx of HELLP syndrome, currently pregnant    Hypertension    Normal labor and delivery 05/22/2015   NVD (normal vaginal delivery) 05/22/2015    Past Surgical History:  Procedure Laterality Date   CESAREAN SECTION     COLPOSCOPY      Family History  Problem Relation Age of Onset   Asthma Mother    Diabetes Mother    Learning disabilities Son    Diabetes Maternal Grandmother    Diabetes Maternal Grandfather    Diabetes Father    Alcohol abuse Neg Hx    Arthritis Neg Hx    Birth defects Neg Hx    Cancer Neg Hx    COPD Neg Hx    Depression Neg Hx    Drug abuse Neg Hx    Early death Neg Hx    Hearing loss Neg Hx    Heart disease Neg  Hx    Hyperlipidemia Neg Hx    Hypertension Neg Hx    Kidney disease Neg Hx    Mental illness Neg Hx    Mental retardation Neg Hx    Miscarriages / Stillbirths Neg Hx    Stroke Neg Hx    Vision loss Neg Hx    Varicose Veins Neg Hx     Social History   Tobacco Use   Smoking status: Never   Smokeless tobacco: Never  Vaping Use   Vaping Use: Former  Substance Use Topics   Alcohol use: Not Currently    Comment: occasional use- not during pregnancy   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 30 tablet 3 Past Week   Prenatal Vit-Fe Fumarate-FA (PRENATAL PO) Take by mouth.   Past Week   aspirin EC 81 MG tablet Take 1 tablet (81 mg total) by mouth daily. Swallow whole. 30 tablet 12    Blood Pressure Monitoring (BLOOD PRESSURE KIT) DEVI 1 each by Does not apply route once a week. 1 each 0     Review of Systems  Constitutional:  Positive for chills. Negative for fever.  Eyes:  Negative for visual disturbance.  Gastrointestinal:  Positive for nausea and vomiting.  Genitourinary:  Positive for dysuria. Negative for difficulty urinating, vaginal bleeding and vaginal discharge.  Neurological:  Positive for headaches. Negative for dizziness and light-headedness.   Physical Exam   Blood pressure 117/72, pulse 71, temperature 98 F (36.7 C), resp. rate 17, height '5\' 1"'  (1.549 m), weight 66.7 kg, last menstrual period 11/15/2021, SpO2 100 %.  Physical Exam Constitutional:      Appearance: Normal appearance. She is well-developed.  HENT:     Head: Normocephalic and atraumatic.  Eyes:     Conjunctiva/sclera: Conjunctivae normal.  Cardiovascular:     Rate and Rhythm: Normal rate.  Musculoskeletal:        General: Normal range of motion.     Cervical back:  Normal range of motion.  Skin:    General: Skin is warm and dry.  Neurological:     Mental Status: She is alert and oriented to person, place, and time.     Cranial Nerves: No cranial nerve deficit.  Psychiatric:        Mood and Affect: Mood normal.        Behavior: Behavior normal.     MAU Course  Procedures Results for orders placed or performed during the hospital encounter of 03/01/22 (from the past 24 hour(s))  Urinalysis, Routine w reflex microscopic Urine, Clean Catch     Status: Abnormal   Collection Time: 03/01/22  7:49 AM  Result Value Ref Range   Color, Urine YELLOW YELLOW   APPearance CLEAR CLEAR   Specific Gravity, Urine 1.012 1.005 - 1.030   pH 6.0 5.0 - 8.0   Glucose, UA NEGATIVE NEGATIVE mg/dL   Hgb urine dipstick NEGATIVE NEGATIVE   Bilirubin Urine NEGATIVE NEGATIVE   Ketones, ur 20 (A) NEGATIVE mg/dL   Protein, ur NEGATIVE NEGATIVE mg/dL   Nitrite NEGATIVE NEGATIVE   Leukocytes,Ua NEGATIVE NEGATIVE    MDM Start IV LR Bolus Pain medication Prescription Assessment and Plan  36 year old, [id number]  SIUP at 15.1 weeks Headache  -Nurse instructed to start IV and give fluids.  -Reviewed POC with patient. -Exam performed and findings discussed.  -Reviewed usage of oral medications and patient agreeable.  -Excedrin  -Will monitor and reassess.  -UA and UC sent.  Maryann Conners 03/01/2022, 6:50 AM   Reassessment (7:52 AM)  -Patient reports improvement in HA and now rates 2/10. -Patient offered and agreeable to prescription. -Rx for Excedrin tension sent to pharmacy on file.  -Discussed continued monitoring of and reporting any increase or unresolved symptoms. -Precautions reviewed. -Discharged to home in stable condition.  Maryann Conners MSN, CNM Advanced Practice Provider, [facility] for [facility]

## 2022-03-01 NOTE — MAU Note (Addendum)
.  Karina Torres is a 36 y.o. at [redacted]w[redacted]d here in MAU reporting headache since Weds. 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 Was in the heat hour and half waiting for help and has had h/a since then. Some abdominal pain that is mostly upper abdomen. Nausea had improved but last few days has been worse. Vomited last night. Denies VB. Tylenol is not helping h/a. Took Tylenol 500mg  x2 at 0400. Some dysuria. Had gastritis on occ for which she uses PRotonix but has not taken it recently.  Onset of complaint: Weds Pain score: 8 for h/a and abdomen 5 when it hurts Vitals:   03/01/22 0555 03/01/22 0556  BP:  106/70  Pulse: 79   Resp: 17   Temp: 98 F (36.7 C)   SpO2: 100%      FHT:137 Lab orders placed from triage:  u/a

## 2022-03-02 LAB — CULTURE, OB URINE: Culture: NO GROWTH

## 2022-03-03 ENCOUNTER — Encounter: Payer: Self-pay | Admitting: Radiology

## 2022-03-07 ENCOUNTER — Ambulatory Visit (INDEPENDENT_AMBULATORY_CARE_PROVIDER_SITE_OTHER): Payer: BC Managed Care – PPO | Admitting: Advanced Practice Midwife

## 2022-03-07 ENCOUNTER — Other Ambulatory Visit: Payer: Self-pay

## 2022-03-07 VITALS — BP 118/86 | HR 84 | Wt 150.1 lb

## 2022-03-07 DIAGNOSIS — O09522 Supervision of elderly multigravida, second trimester: Secondary | ICD-10-CM

## 2022-03-07 DIAGNOSIS — O09299 Supervision of pregnancy with other poor reproductive or obstetric history, unspecified trimester: Secondary | ICD-10-CM

## 2022-03-07 DIAGNOSIS — O0990 Supervision of high risk pregnancy, unspecified, unspecified trimester: Secondary | ICD-10-CM

## 2022-03-07 DIAGNOSIS — Z98891 History of uterine scar from previous surgery: Secondary | ICD-10-CM

## 2022-03-07 DIAGNOSIS — O09891 Supervision of other high risk pregnancies, first trimester: Secondary | ICD-10-CM

## 2022-03-07 DIAGNOSIS — Z3A16 16 weeks gestation of pregnancy: Secondary | ICD-10-CM

## 2022-03-07 NOTE — Progress Notes (Unsigned)
   PRENATAL VISIT NOTE  Subjective:  Karina Torres is a 36 y.o. G3P1102 at [redacted]w[redacted]d being seen today for ongoing prenatal care.  She is currently monitored for the following issues for this {Blank single:19197::"high-risk","low-risk"} pregnancy and has History of HELLP syndrome, currently pregnant; History of C-section; Supervision of high risk pregnancy, antepartum; AMA (advanced maternal age) multigravida 35+; History of preterm delivery, currently pregnant in first trimester; and Abnormal genetic test on their problem list.  Patient reports {sx:[id number]}.  Contractions: Not present. Vag. Bleeding: None.  Movement: Present. Denies leaking of fluid.   The following portions of the patient's history were reviewed and updated as appropriate: allergies, current medications, past family history, past medical history, past social history, past surgical history and problem list.   Objective:   Vitals:   03/07/22 1044  BP: 118/86  Pulse: 84  Weight: 150 lb 1.6 oz (68.1 kg)    Fetal Status: Fetal Heart Rate (bpm): 138   Movement: Present     General:  Alert, oriented and cooperative. Patient is in no acute distress.  Skin: Skin is warm and dry. No rash noted.   Cardiovascular: Normal heart rate noted  Respiratory: Normal respiratory effort, no problems with respiration noted  Abdomen: Soft, gravid, appropriate for gestational age.  Pain/Pressure: Present     Pelvic: {Blank single:19197::"Cervical exam performed in the presence of a chaperone","Cervical exam deferred"}        Extremities: Normal range of motion.  Edema: None  Mental Status: Normal mood and affect. Normal behavior. Normal judgment and thought content.   Assessment and Plan:  Pregnancy: G3P1102 at [redacted]w[redacted]d 1. [redacted] weeks gestation of pregnancy  - AFP, Serum, Open Spina Bifida  2. History of C-section ***  3. Multigravida of advanced maternal age in second trimester ***  4. History of preterm delivery, currently pregnant in  first trimester ***  {Blank single:19197::"Term","Preterm"} labor symptoms and general obstetric precautions including but not limited to vaginal bleeding, contractions, leaking of fluid and fetal movement were reviewed in detail with the patient. Please refer to After Visit Summary for other counseling recommendations.   No follow-ups on file.  Future Appointments  Date Time Provider Department Center  04/01/2022  9:15 AM [facility] NURSE [facility]  04/01/2022  9:30 AM [facility] US2 [facility]    Dorathy Kinsman, CNM

## 2022-03-09 LAB — AFP, SERUM, OPEN SPINA BIFIDA
AFP MoM: 1.28
AFP Value: 48.8 ng/mL
Gest. Age on Collection Date: 16 weeks
Maternal Age At EDD: 36.5 yr
OSBR Risk 1 IN: 10000
Test Results:: NEGATIVE
Weight: 150 [lb_av]

## 2022-04-01 ENCOUNTER — Ambulatory Visit: Payer: BC Managed Care – PPO

## 2022-04-04 ENCOUNTER — Ambulatory Visit (HOSPITAL_BASED_OUTPATIENT_CLINIC_OR_DEPARTMENT_OTHER): Payer: BC Managed Care – PPO

## 2022-04-04 ENCOUNTER — Other Ambulatory Visit: Payer: Self-pay

## 2022-04-04 ENCOUNTER — Ambulatory Visit: Payer: BC Managed Care – PPO | Attending: Obstetrics and Gynecology | Admitting: *Deleted

## 2022-04-04 ENCOUNTER — Ambulatory Visit (INDEPENDENT_AMBULATORY_CARE_PROVIDER_SITE_OTHER): Payer: BC Managed Care – PPO | Admitting: Certified Nurse Midwife

## 2022-04-04 VITALS — BP 106/66 | HR 67

## 2022-04-04 VITALS — BP 106/65 | HR 97

## 2022-04-04 DIAGNOSIS — O09293 Supervision of pregnancy with other poor reproductive or obstetric history, third trimester: Secondary | ICD-10-CM | POA: Insufficient documentation

## 2022-04-04 DIAGNOSIS — O09292 Supervision of pregnancy with other poor reproductive or obstetric history, second trimester: Secondary | ICD-10-CM | POA: Diagnosis not present

## 2022-04-04 DIAGNOSIS — O1493 Unspecified pre-eclampsia, third trimester: Secondary | ICD-10-CM | POA: Diagnosis not present

## 2022-04-04 DIAGNOSIS — O35BXX0 Maternal care for other (suspected) fetal abnormality and damage, fetal cardiac anomalies, not applicable or unspecified: Secondary | ICD-10-CM | POA: Diagnosis not present

## 2022-04-04 DIAGNOSIS — Z98891 History of uterine scar from previous surgery: Secondary | ICD-10-CM | POA: Diagnosis not present

## 2022-04-04 DIAGNOSIS — O0990 Supervision of high risk pregnancy, unspecified, unspecified trimester: Secondary | ICD-10-CM

## 2022-04-04 DIAGNOSIS — O34219 Maternal care for unspecified type scar from previous cesarean delivery: Secondary | ICD-10-CM

## 2022-04-04 DIAGNOSIS — Z3A20 20 weeks gestation of pregnancy: Secondary | ICD-10-CM

## 2022-04-04 DIAGNOSIS — O285 Abnormal chromosomal and genetic finding on antenatal screening of mother: Secondary | ICD-10-CM

## 2022-04-04 DIAGNOSIS — Z3A19 19 weeks gestation of pregnancy: Secondary | ICD-10-CM

## 2022-04-04 DIAGNOSIS — O09891 Supervision of other high risk pregnancies, first trimester: Secondary | ICD-10-CM

## 2022-04-04 DIAGNOSIS — O09522 Supervision of elderly multigravida, second trimester: Secondary | ICD-10-CM

## 2022-04-04 DIAGNOSIS — O09212 Supervision of pregnancy with history of pre-term labor, second trimester: Secondary | ICD-10-CM | POA: Diagnosis not present

## 2022-04-04 DIAGNOSIS — O09299 Supervision of pregnancy with other poor reproductive or obstetric history, unspecified trimester: Secondary | ICD-10-CM

## 2022-04-04 DIAGNOSIS — O0992 Supervision of high risk pregnancy, unspecified, second trimester: Secondary | ICD-10-CM

## 2022-04-04 DIAGNOSIS — Z148 Genetic carrier of other disease: Secondary | ICD-10-CM

## 2022-04-04 DIAGNOSIS — O09523 Supervision of elderly multigravida, third trimester: Secondary | ICD-10-CM | POA: Insufficient documentation

## 2022-04-04 NOTE — Progress Notes (Signed)
   PRENATAL VISIT NOTE  Subjective:  Karina Torres is a 36 y.o. G3P1102 at [redacted]w[redacted]d being seen today for ongoing prenatal care.  She is currently monitored for the following issues for this high-risk pregnancy and has History of HELLP syndrome, currently pregnant; History of C-section; Supervision of high risk pregnancy, antepartum; AMA (advanced maternal age) multigravida 41+; History of preterm delivery, currently pregnant in first trimester; and Abnormal genetic test on their problem list.  Patient reports no complaints.  Contractions: Not present. Vag. Bleeding: None.  Movement: Present. Denies leaking of fluid.   The following portions of the patient's history were reviewed and updated as appropriate: allergies, current medications, past family history, past medical history, past social history, past surgical history and problem list.   Objective:   Vitals:   04/04/22 1315  BP: 106/65  Pulse: 97    Fetal Status: Fetal Heart Rate (bpm): 140   Movement: Present     General:  Alert, oriented and cooperative. Patient is in no acute distress.  Skin: Skin is warm and dry. No rash noted.   Cardiovascular: Normal heart rate noted  Respiratory: Normal respiratory effort, no problems with respiration noted  Abdomen: Soft, gravid, appropriate for gestational age.  Pain/Pressure: Present     Pelvic: Cervical exam deferred        Extremities: Normal range of motion.  Edema: None  Mental Status: Normal mood and affect. Normal behavior. Normal judgment and thought content.   Assessment and Plan:  Pregnancy: G3P1102 at [redacted]w[redacted]d 1. Supervision of high risk pregnancy, antepartum - Patient beginning to feel flutters occasionally.  - Reassured patient that as pregnancy continues to progress, fetal movement will become more prominent and more frequent.   2. History of C-section - Hx of C/s followed by a VBAC x1 - Patient planning vaginal delivery with this pregnancy.   3. Multigravida of advanced  maternal age in second trimester  4. [redacted] weeks gestation of pregnancy - Reviewed monthly pregnancy schedule.   5. History of pre-eclampsia in prior pregnancy, currently pregnant - BPs normotensive at this visit.   Preterm labor symptoms and general obstetric precautions including but not limited to vaginal bleeding, contractions, leaking of fluid and fetal movement were reviewed in detail with the patient. Please refer to After Visit Summary for other counseling recommendations.   Return in about 4 weeks (around 05/02/2022) for LOB.  Future Appointments  Date Time Provider [place]  05/02/2022  2:35 PM Cresenzo, Angelyn Punt, MD [facility]    Shantel Wesely Isaias Sakai) Rollene Rotunda, MSN, [place] [facility]  04/06/22 2:14 PM

## 2022-04-08 ENCOUNTER — Encounter: Payer: Self-pay | Admitting: Medical

## 2022-04-08 DIAGNOSIS — O283 Abnormal ultrasonic finding on antenatal screening of mother: Secondary | ICD-10-CM | POA: Insufficient documentation

## 2022-05-02 ENCOUNTER — Ambulatory Visit (INDEPENDENT_AMBULATORY_CARE_PROVIDER_SITE_OTHER): Payer: BC Managed Care – PPO | Admitting: Student

## 2022-05-02 ENCOUNTER — Other Ambulatory Visit: Payer: Self-pay

## 2022-05-02 VITALS — BP 107/72 | HR 86 | Wt 160.7 lb

## 2022-05-02 DIAGNOSIS — O0992 Supervision of high risk pregnancy, unspecified, second trimester: Secondary | ICD-10-CM

## 2022-05-02 DIAGNOSIS — O09299 Supervision of pregnancy with other poor reproductive or obstetric history, unspecified trimester: Secondary | ICD-10-CM

## 2022-05-02 DIAGNOSIS — O09522 Supervision of elderly multigravida, second trimester: Secondary | ICD-10-CM

## 2022-05-02 DIAGNOSIS — O09292 Supervision of pregnancy with other poor reproductive or obstetric history, second trimester: Secondary | ICD-10-CM

## 2022-05-02 DIAGNOSIS — O0990 Supervision of high risk pregnancy, unspecified, unspecified trimester: Secondary | ICD-10-CM

## 2022-05-02 DIAGNOSIS — Z3A24 24 weeks gestation of pregnancy: Secondary | ICD-10-CM

## 2022-05-02 NOTE — Progress Notes (Signed)
   PRENATAL VISIT NOTE  Subjective:  Karina Torres is a 36 y.o. G3P1102 at [redacted]w[redacted]d being seen today for ongoing prenatal care.  She is currently monitored for the following issues for this high-risk pregnancy and has History of HELLP syndrome, currently pregnant; History of C-section; Supervision of high risk pregnancy, antepartum; AMA (advanced maternal age) multigravida 35+; History of preterm delivery, currently pregnant in first trimester; Abnormal genetic test; and Fetal echogenic intracardiac focus on prenatal ultrasound on their problem list.  Patient reports occasional contractions. Describes this occurring while she is on her feet at work. Contractions: Irregular.  .  Movement: Present. Denies leaking of fluid.   The following portions of the patient's history were reviewed and updated as appropriate: allergies, current medications, past family history, past medical history, past social history, past surgical history and problem list.   Objective:   Vitals:   05/02/22 1529  BP: 107/72  Pulse: 86  Weight: 160 lb 11.2 oz (72.9 kg)    Fetal Status: Fetal Heart Rate (bpm): 134 Fundal Height: 24 cm Movement: Present     General:  Alert, oriented and cooperative. Patient is in no acute distress.  Skin: Skin is warm and dry. No rash noted.   Cardiovascular: Normal heart rate noted  Respiratory: Normal respiratory effort, no problems with respiration noted  Abdomen: Soft, gravid, appropriate for gestational age.  Pain/Pressure: Present     Pelvic: Cervical exam deferred        Extremities: Normal range of motion.     Mental Status: Normal mood and affect. Normal behavior. Normal judgment and thought content.   Assessment and Plan:  Pregnancy: G3P1102 at [redacted]w[redacted]d  1. Supervision of high risk pregnancy, antepartum - Doing well, frequent fetal movement - Discussed Braxton-Hicks vs. Active Labor. Encouraged patient to seek immediate medical attention if contractions start to become more  frequent (Q79minutes x 1hr) and are not resolving with rest/sitting down. Precautions provided d/t hx of preterm labor. Irregular pain seems to be activity related currently and resolve with rest. Reassurance provided. - Considering depo for PP contraception   2. [redacted] weeks gestation of pregnancy - GTT at next visit  3. Multigravida of advanced maternal age in second trimester - Detail anatomy completed  4. History of pre-eclampsia in prior pregnancy, currently pregnant - Normotensive today   Preterm labor symptoms and general obstetric precautions including but not limited to vaginal bleeding, contractions, leaking of fluid and fetal movement were reviewed in detail with the patient. Please refer to After Visit Summary for other counseling recommendations.   Return in about 4 weeks (around 05/30/2022) for LOB/GTT, IN-PERSON.  Future Appointments  Date Time Provider Department Center  05/30/2022  8:15 AM Lennart Pall, MD [facility]  05/30/2022  8:50 AM [facility] LAB [facility]    Corlis Hove, NP

## 2022-05-09 ENCOUNTER — Inpatient Hospital Stay (HOSPITAL_COMMUNITY)
Admission: AD | Admit: 2022-05-09 | Discharge: 2022-05-09 | Disposition: A | Discharge status: Home / Self Care | Payer: BC Managed Care – PPO | Attending: Obstetrics and Gynecology | Admitting: Obstetrics and Gynecology

## 2022-05-09 DIAGNOSIS — M79601 Pain in right arm: Secondary | ICD-10-CM | POA: Insufficient documentation

## 2022-05-09 DIAGNOSIS — R197 Diarrhea, unspecified: Secondary | ICD-10-CM | POA: Diagnosis not present

## 2022-05-09 DIAGNOSIS — M779 Enthesopathy, unspecified: Secondary | ICD-10-CM

## 2022-05-09 DIAGNOSIS — O283 Abnormal ultrasonic finding on antenatal screening of mother: Secondary | ICD-10-CM

## 2022-05-09 DIAGNOSIS — O26892 Other specified pregnancy related conditions, second trimester: Secondary | ICD-10-CM | POA: Insufficient documentation

## 2022-05-09 DIAGNOSIS — Z3A25 25 weeks gestation of pregnancy: Secondary | ICD-10-CM | POA: Insufficient documentation

## 2022-05-09 DIAGNOSIS — O09891 Supervision of other high risk pregnancies, first trimester: Secondary | ICD-10-CM

## 2022-05-09 DIAGNOSIS — O0990 Supervision of high risk pregnancy, unspecified, unspecified trimester: Secondary | ICD-10-CM

## 2022-05-09 NOTE — MAU Provider Note (Signed)
History     [id number]  Arrival date and time: 05/09/22 0911    Chief Complaint  Patient presents with   Right Arm Discomfort   Bowl Movements     HPI Karina Torres is a 36 y.o. at 43w0dwith PMHx notable for AMA, hx of HELLP syndrome, hx of C-section, who presents for multiple complaints.   She reports multiple issues over last few days that she is worried about Most concerning to her this morning was pain in her R bicep that radiated up and down her arm No difficulty with strength or grip, works [place] [place] has a vein that pops out occasionally just above her belly button, mostly when she eats, not present now Also had green colored diarrhea yesterday and is worried about it No vaginal bleeding or leaking fluid No contractions Endorses good fetal movement   A/Positive/-- (08/15 1052)  OB History     Gravida  3   Para  2   Term  1   Preterm  1   AB      Living  2      SAB      IAB      Ectopic      Multiple  0   Live Births  2           Past Medical History:  Diagnosis Date   Hx of chlamydia infection    Hx of HELLP syndrome, currently pregnant    Hypertension    Normal labor and delivery 05/22/2015   NVD (normal vaginal delivery) 05/22/2015    Past Surgical History:  Procedure Laterality Date   CESAREAN SECTION     COLPOSCOPY      Family History  Problem Relation Age of Onset   Asthma Mother    Diabetes Mother    Learning disabilities Son    Diabetes Maternal Grandmother    Diabetes Maternal Grandfather    Diabetes Father    Alcohol abuse Neg Hx    Arthritis Neg Hx    Birth defects Neg Hx    Cancer Neg Hx    COPD Neg Hx    Depression Neg Hx    Drug abuse Neg Hx    Early death Neg Hx    Hearing loss Neg Hx    Heart disease Neg Hx    Hyperlipidemia Neg Hx    Hypertension Neg Hx    Kidney disease Neg Hx    Mental illness Neg Hx    Mental retardation Neg Hx    Miscarriages / Stillbirths Neg Hx    Stroke Neg Hx     Vision loss Neg Hx    Varicose Veins Neg Hx     Social History   Socioeconomic History   Marital status: Single    Spouse name: Not on file   Number of children: Not on file   Years of education: Not on file   Highest education level: Not on file  Occupational History   Not on file  Tobacco Use   Smoking status: Never   Smokeless tobacco: Never  Vaping Use   Vaping Use: Former  Substance and Sexual Activity   Alcohol use: Not Currently    Comment: occasional use- not during pregnancy   Drug use: 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   Current Outpatient Medications on File Prior to Encounter  Medication Sig Dispense Refill   acetaminophen-caffeine (EXCEDRIN TENSION HEADACHE) 500-65 MG TABS per tablet Take 2 tablets by mouth every 6 (six) hours. 60 tablet 0   aspirin EC 81 MG tablet Take 1 tablet (81 mg total) by mouth daily. Swallow whole. (Patient not taking: Reported on 04/04/2022) 30 tablet 12   Blood Pressure Monitoring (BLOOD PRESSURE KIT) DEVI 1 each by Does not apply route once a week. (Patient not taking: Reported on 04/04/2022) 1 each 0   pantoprazole (PROTONIX) 20 MG tablet Take 1 tablet (20 mg total) by mouth daily. (Patient not taking: Reported on 04/04/2022) 30 tablet 3   Prenatal Vit-Fe Fumarate-FA (PRENATAL PO) Take by mouth.       ROS Pertinent positives and negative per HPI, all others reviewed and negative  Physical Exam   BP  100/71 (BP Location: Right Arm)   Pulse 87   Temp 98.3 F (36.8 C) (Oral)   Resp 15   Ht _0  (1.549 m)   Wt 72.8 kg   LMP 11/15/2021 (Exact Date)   SpO2 98%   BMI 30.31 kg/m   Patient Vitals for the past 24 hrs:  BP Temp Temp src Pulse Resp SpO2 Height Weight  05/09/22 0953 100/71 -- -- 87 -- -- _1  (1.549 m) 72.8 kg  05/09/22 0926 107/69 98.3 F (36.8 C) Oral 96 15 98 % -- --    Physical Exam Vitals reviewed.  Constitutional:      General: She is not in acute distress.    Appearance: She is well-developed. She is not diaphoretic.  Eyes:     General: No scleral icterus. Cardiovascular:     Comments: R radial pulse 2+ Pulmonary:     Effort: Pulmonary effort is normal. No respiratory distress.  Abdominal:     General: There is no distension.     Palpations: Abdomen is soft.     Tenderness: There is no abdominal tenderness. There is no guarding or rebound.     Comments: Barely visible vein without any firmness or other concerning findings on central abdomen  Musculoskeletal:     Comments: Mild tenderness if biceps tendons at elbow insertion No swelling or edema of RUE  Skin:    General: Skin is warm and dry.  Neurological:     Mental Status: She is alert.     Coordination: Coordination normal.     Comments: RUE with 5/5 strength throughout      Cervical Exam    Bedside Ultrasound Not done  My interpretation: n/a  FHT 135 bpm by doppler  Labs No results found for this or any previous visit (from the past 24 hour(s)).  Imaging No results found.  MAU Course  Procedures Lab Orders  No laboratory test(s) ordered today   No orders of the defined types were placed in this encounter.  Imaging Orders  No imaging studies ordered today    MDM moderate  Assessment and Plan  #R arm pain #[redacted] weeks gestation  of pregnancy Mild muscle strain vs possibly overlapping symptoms of carpal tunnel. CV and neuro exams intact. Reassured patient.   #Abdominal  vein Unclear etiology but does not appear to be caused by any serious pathology, may be due to advancing gestational age or concurrent with another unrelated etiology such as diastasis recti.   #Diarrhea Reassured patient likely viral GI illness. Encouraged hydration.   #FWB FHR normal   Dispo: discharged to home in stable condition.   Clarnce Flock, MD/MPH 05/09/22 2:42 PM  Allergies as of 05/09/2022   No Known Allergies      Medication List     TAKE these medications    aspirin EC 81 MG tablet Take 1 tablet (81 mg total) by mouth daily. Swallow whole.   Blood Pressure Kit Devi 1 each by Does not apply route once a week.   Excedrin Tension Headache 500-65 MG Tabs per tablet Generic drug: acetaminophen-caffeine Take 2 tablets by mouth every 6 (six) hours.   pantoprazole 20 MG tablet Commonly known as: Protonix Take 1 tablet (20 mg total) by mouth daily.   PRENATAL PO Take by mouth.

## 2022-05-09 NOTE — MAU Note (Addendum)
...  Karina Torres is a 36 y.o. at [redacted]w[redacted]d here in MAU reporting: She reports she has been having bowel movements each day which is not normal for her and reports her stool yesterday was a dark green color and this morning when she had a bowel movement it was "light green." She also reports when she eats she notices a "little vein" pop up above her belly button and reports it goes away eventually but this morning "it's still there." She reports her right arm also feels "heavy and tired" and it started this morning while in her sons room and she reports she was holding her phone. Denies VB or LOF. +FM.  Patient reports her concern today is her right arm fatigue.  Pain score: Denies pain.   FHT: 135 initial external

## 2022-05-27 ENCOUNTER — Other Ambulatory Visit: Payer: Self-pay

## 2022-05-27 ENCOUNTER — Encounter: Payer: Self-pay | Admitting: Obstetrics and Gynecology

## 2022-05-27 DIAGNOSIS — O0990 Supervision of high risk pregnancy, unspecified, unspecified trimester: Secondary | ICD-10-CM

## 2022-05-30 ENCOUNTER — Other Ambulatory Visit: Payer: BC Managed Care – PPO

## 2022-05-30 ENCOUNTER — Ambulatory Visit (INDEPENDENT_AMBULATORY_CARE_PROVIDER_SITE_OTHER): Payer: BC Managed Care – PPO | Admitting: Obstetrics and Gynecology

## 2022-05-30 ENCOUNTER — Encounter: Payer: Self-pay | Admitting: Obstetrics and Gynecology

## 2022-05-30 ENCOUNTER — Other Ambulatory Visit: Payer: Self-pay

## 2022-05-30 VITALS — BP 94/56 | HR 67 | Wt 166.6 lb

## 2022-05-30 DIAGNOSIS — O0993 Supervision of high risk pregnancy, unspecified, third trimester: Secondary | ICD-10-CM

## 2022-05-30 DIAGNOSIS — Z23 Encounter for immunization: Secondary | ICD-10-CM | POA: Diagnosis not present

## 2022-05-30 DIAGNOSIS — Z3A28 28 weeks gestation of pregnancy: Secondary | ICD-10-CM | POA: Diagnosis not present

## 2022-05-30 DIAGNOSIS — O09293 Supervision of pregnancy with other poor reproductive or obstetric history, third trimester: Secondary | ICD-10-CM

## 2022-05-30 DIAGNOSIS — Z98891 History of uterine scar from previous surgery: Secondary | ICD-10-CM

## 2022-05-30 DIAGNOSIS — O0990 Supervision of high risk pregnancy, unspecified, unspecified trimester: Secondary | ICD-10-CM

## 2022-05-30 DIAGNOSIS — O09299 Supervision of pregnancy with other poor reproductive or obstetric history, unspecified trimester: Secondary | ICD-10-CM

## 2022-05-30 NOTE — Progress Notes (Addendum)
   PRENATAL VISIT NOTE  Subjective:  Karina Torres is a 36 y.o. G3P1102 at [redacted]w[redacted]d being seen today for ongoing prenatal care.  She is currently monitored for the following issues for this low-risk pregnancy and has History of HELLP syndrome, currently pregnant; History of C-section; Supervision of high risk pregnancy, antepartum; AMA (advanced maternal age) multigravida 35+; History of preterm delivery, currently pregnant in first trimester; SMA carrier; and Fetal echogenic intracardiac focus on prenatal ultrasound on their problem list.  Patient reports carpal tunnel symptoms.  Contractions: Irritability. Vag. Bleeding: None.  Movement: Present. Denies leaking of fluid.   Seen in MAU 11/16. Reports symptoms have largely resolved except for carpal tunnel. Otherwise doing well.   The following portions of the patient's history were reviewed and updated as appropriate: allergies, current medications, past family history, past medical history, past social history, past surgical history and problem list.   Objective:   Vitals:   05/30/22 0821  BP: (!) 94/56  Pulse: 67  Weight: 166 lb 9.6 oz (75.6 kg)    Fetal Status: Fetal Heart Rate (bpm): 132 Fundal Height: 30 cm Movement: Present     General:  Alert, oriented and cooperative. Patient is in no acute distress.  Skin: Skin is warm and dry. No rash noted.   Cardiovascular: Normal heart rate noted  Respiratory: Normal respiratory effort, no problems with respiration noted  Abdomen: Soft, gravid, appropriate for gestational age.  Pain/Pressure: Absent      Assessment and Plan:  Pregnancy: G3P1102 at [redacted]w[redacted]d 1. Supervision of high risk pregnancy, antepartum CBC, HIV, RPR, & 2h GTT collected  Tdap today Declines flu shot  Dental letter provided  2. History of C-section Desires TOLAC. Specifically reviewed 1% risk of uterine rupture necessitating emergency cesarean delivery with risk of life threatening hemorrhage and/or injury for both  patient & baby, reviewed risk of bleeding, infection, and injury to surrounding organs during a labored CS compared to scheduled CS. Reviewed benefits of successful VBAC. Pt plans epidural during labor  TOLAC/VBAC consent signed today  3. History of HELLP syndrome, currently pregnant Normotensive today, reviewed preE precautions Non-adherent with ldASA. Counseled pt on reasoning behind our recommendations. She understands the benefits, but does not plan to continue taking ldASA.  Preterm labor symptoms and general obstetric precautions including but not limited to vaginal bleeding, contractions, leaking of fluid and fetal movement were reviewed in detail with the patient. Please refer to After Visit Summary for other counseling recommendations.   Return in about 2 weeks (around 06/13/2022) for low risk return OB.  Future Appointments  Date Time Provider Department Center  05/30/2022  8:50 AM [facility]   Lennart Pall, MD

## 2022-05-31 LAB — CBC
Hematocrit: 33.7 % — ABNORMAL LOW (ref 34.0–46.6)
Hemoglobin: 11.3 g/dL (ref 11.1–15.9)
MCH: 30.3 pg (ref 26.6–33.0)
MCHC: 33.5 g/dL (ref 31.5–35.7)
MCV: 90 fL (ref 79–97)
Platelets: 270 10*3/uL (ref 150–450)
RBC: 3.73 x10E6/uL — ABNORMAL LOW (ref 3.77–5.28)
RDW: 12.5 % (ref 11.7–15.4)
WBC: 9.1 10*3/uL (ref 3.4–10.8)

## 2022-05-31 LAB — GLUCOSE TOLERANCE, 2 HOURS W/ 1HR
Glucose, 1 hour: 99 mg/dL (ref 70–179)
Glucose, 2 hour: 110 mg/dL (ref 70–152)
Glucose, Fasting: 79 mg/dL (ref 70–91)

## 2022-05-31 LAB — HIV ANTIBODY (ROUTINE TESTING W REFLEX): HIV Screen 4th Generation wRfx: NONREACTIVE

## 2022-05-31 LAB — RPR: RPR Ser Ql: NONREACTIVE

## 2022-06-06 ENCOUNTER — Encounter: Payer: Self-pay | Admitting: *Deleted

## 2022-06-13 ENCOUNTER — Encounter: Payer: BC Managed Care – PPO | Admitting: Obstetrics and Gynecology

## 2022-06-14 ENCOUNTER — Other Ambulatory Visit: Payer: Self-pay

## 2022-06-14 ENCOUNTER — Ambulatory Visit (INDEPENDENT_AMBULATORY_CARE_PROVIDER_SITE_OTHER): Payer: BC Managed Care – PPO | Admitting: Obstetrics and Gynecology

## 2022-06-14 VITALS — BP 115/68 | HR 72 | Wt 162.7 lb

## 2022-06-14 DIAGNOSIS — O09892 Supervision of other high risk pregnancies, second trimester: Secondary | ICD-10-CM

## 2022-06-14 DIAGNOSIS — O0990 Supervision of high risk pregnancy, unspecified, unspecified trimester: Secondary | ICD-10-CM

## 2022-06-14 DIAGNOSIS — Z98891 History of uterine scar from previous surgery: Secondary | ICD-10-CM

## 2022-06-14 DIAGNOSIS — O09891 Supervision of other high risk pregnancies, first trimester: Secondary | ICD-10-CM

## 2022-06-14 DIAGNOSIS — O09292 Supervision of pregnancy with other poor reproductive or obstetric history, second trimester: Secondary | ICD-10-CM

## 2022-06-14 DIAGNOSIS — Z3A30 30 weeks gestation of pregnancy: Secondary | ICD-10-CM

## 2022-06-14 DIAGNOSIS — O09522 Supervision of elderly multigravida, second trimester: Secondary | ICD-10-CM

## 2022-06-14 DIAGNOSIS — O0992 Supervision of high risk pregnancy, unspecified, second trimester: Secondary | ICD-10-CM

## 2022-06-14 DIAGNOSIS — O283 Abnormal ultrasonic finding on antenatal screening of mother: Secondary | ICD-10-CM

## 2022-06-14 DIAGNOSIS — O09299 Supervision of pregnancy with other poor reproductive or obstetric history, unspecified trimester: Secondary | ICD-10-CM

## 2022-06-14 NOTE — Progress Notes (Signed)
   PRENATAL VISIT NOTE  Subjective:  Karina Torres is a 36 y.o. G3P1102 at [redacted]w[redacted]d being seen today for ongoing prenatal care.  She is currently monitored for the following issues for this high-risk pregnancy and has History of HELLP syndrome, currently pregnant; History of C-section; Supervision of high risk pregnancy, antepartum; AMA (advanced maternal age) multigravida 35+; History of preterm delivery, currently pregnant in first trimester; SMA carrier; and Fetal echogenic intracardiac focus on prenatal ultrasound on their problem list.  Patient reports no complaints.  Contractions: Irritability. Vag. Bleeding: None.  Movement: Present. Denies leaking of fluid.   The following portions of the patient's history were reviewed and updated as appropriate: allergies, current medications, past family history, past medical history, past social history, past surgical history and problem list.   Objective:   Vitals:   06/14/22 0944  BP: 115/68  Pulse: 72  Weight: 162 lb 11.2 oz (73.8 kg)    Fetal Status: Fetal Heart Rate (bpm): 159   Movement: Present     General:  Alert, oriented and cooperative. Patient is in no acute distress.  Skin: Skin is warm and dry. No rash noted.   Cardiovascular: Normal heart rate noted  Respiratory: Normal respiratory effort, no problems with respiration noted  Abdomen: Soft, gravid, appropriate for gestational age.  Pain/Pressure: Absent     Pelvic: Cervical exam deferred        Extremities: Normal range of motion.  Edema: Trace  Mental Status: Normal mood and affect. Normal behavior. Normal judgment and thought content.   Assessment and Plan:  Pregnancy: G3P1102 at [redacted]w[redacted]d 1. History of C-section Signed tolac consent previously  2. Fetal echogenic intracardiac focus on prenatal ultrasound No f/u needed, normal nipt  3. Multigravida of advanced maternal age in second trimester Nipt wnl, anatomy otherwise wnl  4. History of HELLP syndrome, currently  pregnant BP normal today, continue ldasa  5. History of preterm delivery, currently pregnant in first trimester Due to HELLP  6. Supervision of high risk pregnancy, antepartum All prenatal care up to date Rh pos S/p tdap   Preterm labor symptoms and general obstetric precautions including but not limited to vaginal bleeding, contractions, leaking of fluid and fetal movement were reviewed in detail with the patient. Please refer to After Visit Summary for other counseling recommendations.   Return in about 2 weeks (around 06/28/2022) for OB VISIT, MD or APP.  Future Appointments  Date Time Provider Department Center  06/27/2022  8:15 AM Celedonio Savage, MD [facility]  07/11/2022 10:15 AM Corlis Hove, NP [facility]    Milas Hock, MD

## 2022-06-22 ENCOUNTER — Inpatient Hospital Stay (HOSPITAL_COMMUNITY)
Admission: AD | Admit: 2022-06-22 | Discharge: 2022-06-22 | Disposition: A | Discharge status: Home / Self Care | Payer: BC Managed Care – PPO | Attending: Obstetrics and Gynecology | Admitting: Obstetrics and Gynecology

## 2022-06-22 ENCOUNTER — Other Ambulatory Visit: Payer: Self-pay

## 2022-06-22 ENCOUNTER — Encounter (HOSPITAL_COMMUNITY): Payer: Self-pay | Admitting: Obstetrics and Gynecology

## 2022-06-22 DIAGNOSIS — O26893 Other specified pregnancy related conditions, third trimester: Secondary | ICD-10-CM | POA: Insufficient documentation

## 2022-06-22 DIAGNOSIS — O09213 Supervision of pregnancy with history of pre-term labor, third trimester: Secondary | ICD-10-CM | POA: Insufficient documentation

## 2022-06-22 DIAGNOSIS — R519 Headache, unspecified: Secondary | ICD-10-CM | POA: Insufficient documentation

## 2022-06-22 DIAGNOSIS — O283 Abnormal ultrasonic finding on antenatal screening of mother: Secondary | ICD-10-CM

## 2022-06-22 DIAGNOSIS — Z3A31 31 weeks gestation of pregnancy: Secondary | ICD-10-CM | POA: Diagnosis not present

## 2022-06-22 DIAGNOSIS — O09891 Supervision of other high risk pregnancies, first trimester: Secondary | ICD-10-CM

## 2022-06-22 DIAGNOSIS — O0990 Supervision of high risk pregnancy, unspecified, unspecified trimester: Secondary | ICD-10-CM

## 2022-06-22 LAB — CBC
HCT: 34.3 % — ABNORMAL LOW (ref 36.0–46.0)
Hemoglobin: 11.7 g/dL — ABNORMAL LOW (ref 12.0–15.0)
MCH: 30.7 pg (ref 26.0–34.0)
MCHC: 34.1 g/dL (ref 30.0–36.0)
MCV: 90 fL (ref 80.0–100.0)
Platelets: 264 10*3/uL (ref 150–400)
RBC: 3.81 MIL/uL — ABNORMAL LOW (ref 3.87–5.11)
RDW: 13.1 % (ref 11.5–15.5)
WBC: 9.3 10*3/uL (ref 4.0–10.5)
nRBC: 0 % (ref 0.0–0.2)

## 2022-06-22 LAB — PROTEIN / CREATININE RATIO, URINE
Creatinine, Urine: 113 mg/dL
Protein Creatinine Ratio: 0.05 mg/mg{Cre} (ref 0.00–0.15)
Total Protein, Urine: 6 mg/dL

## 2022-06-22 LAB — URINALYSIS, ROUTINE W REFLEX MICROSCOPIC
Bilirubin Urine: NEGATIVE
Glucose, UA: NEGATIVE mg/dL
Hgb urine dipstick: NEGATIVE
Ketones, ur: 5 mg/dL — AB
Leukocytes,Ua: NEGATIVE
Nitrite: NEGATIVE
Protein, ur: NEGATIVE mg/dL
Specific Gravity, Urine: 1.015 (ref 1.005–1.030)
pH: 6 (ref 5.0–8.0)

## 2022-06-22 LAB — COMPREHENSIVE METABOLIC PANEL
ALT: 14 U/L (ref 0–44)
AST: 19 U/L (ref 15–41)
Albumin: 2.7 g/dL — ABNORMAL LOW (ref 3.5–5.0)
Alkaline Phosphatase: 86 U/L (ref 38–126)
Anion gap: 8 (ref 5–15)
BUN: 5 mg/dL — ABNORMAL LOW (ref 6–20)
CO2: 24 mmol/L (ref 22–32)
Calcium: 8.6 mg/dL — ABNORMAL LOW (ref 8.9–10.3)
Chloride: 104 mmol/L (ref 98–111)
Creatinine, Ser: 0.74 mg/dL (ref 0.44–1.00)
GFR, Estimated: >60 mL/min (ref >60)
Glucose, Bld: 117 mg/dL — ABNORMAL HIGH (ref 70–99)
Potassium: 3.8 mmol/L (ref 3.5–5.1)
Sodium: 136 mmol/L (ref 135–145)
Total Bilirubin: 0.3 mg/dL (ref 0.3–1.2)
Total Protein: 5.8 g/dL — ABNORMAL LOW (ref 6.5–8.1)

## 2022-06-22 MED ORDER — DIPHENHYDRAMINE HCL 50 MG/ML IJ SOLN
12.5000 mg | Freq: Once | INTRAMUSCULAR | Status: AC
  Administered 2022-06-22: 12.5 mg via INTRAVENOUS
  Filled 2022-06-22: qty 1

## 2022-06-22 MED ORDER — LACTATED RINGERS IV BOLUS
1000.0000 mL | Freq: Once | INTRAVENOUS | Status: AC
  Administered 2022-06-22: 1000 mL via INTRAVENOUS

## 2022-06-22 MED ORDER — METOCLOPRAMIDE HCL 5 MG/ML IJ SOLN
10.0000 mg | Freq: Once | INTRAMUSCULAR | Status: AC
  Administered 2022-06-22: 10 mg via INTRAVENOUS
  Filled 2022-06-22: qty 2

## 2022-06-22 NOTE — MAU Note (Signed)
Karina Torres is a 35 y.o. at [redacted]w[redacted]d here in MAU reporting: H/A since Thursday, no relief after taking Tylenol and Excedrin.  Last took Tylenol pm @ 0345 this morning and last took Excedrin last night @ 2000.  Reports some blurred vision, denies epigastric pain.  Denies VB or LOF.  Reports +FM.  Also reports some mild lower abdominal cramping. LMP: NA Onset of complaint: 2 days  Pain score: 6 Vitals:   06/22/22 1153  BP: 119/74  Pulse: 98  Resp: 18  Temp: 98.8 F (37.1 C)  SpO2: 99%     FHT:137 bpm Lab orders placed from triage:   UA

## 2022-06-22 NOTE — MAU Provider Note (Signed)
History     CSN: [id number]  Arrival date and time: 06/22/22 1123   Event Date/Time   First Provider Initiated Contact with Patient 06/22/22 1220      Chief Complaint  Patient presents with   Headache   [place] , a  36 y.o. [id number] at 20w2dpresents to MAU with complaints of on-going headache for the last 2 days. Patient reports a history of migraines and states "this feels similar." She reports "throbbing" behind the "right eye, radiating down into the neck and jaw line." Patient states she attempted PO Excedrin and Tylenol PM without relief. Last doses Excedrin w/ Tension @ 2100 last night and Tylenol PM this morning at 0345. Patient states was able to sleep finally but never got full relief. Water recall: 2 bottles yesterday and 1 bottle this morning. States she was a heavy caffeine drinker prior to pregnancy but has decreased. She endorses Sweet Tea yesterday. She noted some visual disturbances with the headache, but denies epigastric pain and swelling. She denies vaginal bleeding, leaking of fluid and contractions. Endorses positive fetal movement.          OB History     Gravida  3   Para  2   Term  1   Preterm  1   AB      Living  2      SAB      IAB      Ectopic      Multiple  0   Live Births  2           Past Medical History:  Diagnosis Date   Hx of chlamydia infection    Hx of HELLP syndrome, currently pregnant    Hypertension     Past Surgical History:  Procedure Laterality Date   CESAREAN SECTION     COLPOSCOPY      Family History  Problem Relation Age of Onset   Asthma Mother    Diabetes Mother    Learning disabilities Son    Diabetes Maternal Grandmother    Diabetes Maternal Grandfather    Diabetes Father    Alcohol abuse Neg Hx    Arthritis Neg Hx    Birth defects Neg Hx    Cancer Neg Hx    COPD Neg Hx    Depression Neg Hx    Drug abuse Neg Hx    Early death Neg Hx    Hearing loss Neg Hx    Heart disease Neg  Hx    Hyperlipidemia Neg Hx    Hypertension Neg Hx    Kidney disease Neg Hx    Mental illness Neg Hx    Mental retardation Neg Hx    Miscarriages / Stillbirths Neg Hx    Stroke Neg Hx    Vision loss Neg Hx    Varicose Veins Neg Hx     Social History   Tobacco Use   Smoking status: Never   Smokeless tobacco: Never  Vaping Use   Vaping Use: Former  Substance Use Topics   Alcohol use: Not Currently    Comment: occasional use- not during pregnancy   Drug use: Not Currently    Types: Marijuana    Comment: every day user "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 60 tablet 0 06/21/2022 at 2000   Blood Pressure Monitoring (BLOOD PRESSURE KIT) DEVI 1 each by Does not apply route once a week. 1 each 0    Prenatal Vit-Fe Fumarate-FA (PRENATAL PO) Take by mouth.   Unknown    Review of Systems  Constitutional:  Negative for chills, fatigue and fever.  Eyes:  Negative for pain and visual disturbance.  Respiratory:  Negative for apnea, shortness of breath and wheezing.   Cardiovascular:  Negative for chest pain and palpitations.  Gastrointestinal:  Negative for abdominal pain, constipation, diarrhea, nausea and vomiting.  Genitourinary:  Negative for difficulty urinating, dysuria, pelvic pain, vaginal bleeding, vaginal discharge and vaginal pain.  Musculoskeletal:  Negative for back pain.  Neurological:  Positive for headaches. Negative for seizures, facial asymmetry, speech difficulty, weakness and light-headedness.  Psychiatric/Behavioral:  Negative for suicidal ideas.    Physical Exam   Blood pressure 107/66, pulse 88, temperature 98.8 F (37.1 C), temperature source Oral, resp. rate 18, height _0  (1.549 m), weight 74.4 kg, last menstrual period 11/15/2021, SpO2 100  %.  Physical Exam Vitals and nursing note reviewed.  Constitutional:      General: She is not in acute distress.    Appearance: Normal appearance. She is ill-appearing.  HENT:     Head: Normocephalic.  Pulmonary:     Effort: Pulmonary effort is normal.  Abdominal:     Palpations: Abdomen is soft.  Musculoskeletal:        General: Normal range of motion.     Cervical back: Normal range of motion.  Skin:    General: Skin is warm and dry.  Neurological:     Mental Status: She is alert and oriented to person, place, and time.     GCS: GCS eye subscore is 4. GCS verbal subscore is 5. GCS motor subscore is 6.     Cranial Nerves: No facial asymmetry.     Motor: No weakness.     Coordination: Coordination normal.     Gait: Gait normal.     Deep Tendon Reflexes: Reflexes normal.  Psychiatric:        Mood and Affect: Mood normal.    FHT: 135bpm with moderate variability. Accels present. No decels  Toco: quiet.   MAU Course  Procedures Orders Placed This Encounter  Procedures   Urinalysis, Routine w reflex microscopic Urine, Clean Catch   CBC   Comprehensive metabolic panel   Protein / creatinine ratio, urine   Insert peripheral IV   Discharge patient   Meds ordered this encounter  Medications   diphenhydrAMINE (BENADRYL) injection 12.5 mg   metoCLOPramide (REGLAN) injection 10 mg   lactated ringers bolus 1,000 mL   Results for orders placed or performed during the hospital encounter of 06/22/22 (from the past 24 hour(s))  Urinalysis, Routine w reflex microscopic Urine, Clean Catch     Status: Abnormal   Collection Time: 06/22/22 12:01 PM  Result Value Ref Range   Color, Urine YELLOW YELLOW   APPearance CLEAR CLEAR   Specific Gravity, Urine 1.015 1.005 - 1.030   pH 6.0 5.0 - 8.0   Glucose, UA NEGATIVE NEGATIVE mg/dL   Hgb urine dipstick NEGATIVE NEGATIVE   Bilirubin Urine NEGATIVE NEGATIVE   Ketones, ur 5 (A) NEGATIVE mg/dL   Protein, ur NEGATIVE NEGATIVE mg/dL    Nitrite NEGATIVE NEGATIVE   Leukocytes,Ua NEGATIVE NEGATIVE  CBC     Status: Abnormal   Collection Time: 06/22/22 12:43 PM  Result Value Ref  Range   WBC 9.3 4.0 - 10.5 K/uL   RBC 3.81 (L) 3.87 - 5.11 MIL/uL   Hemoglobin 11.7 (L) 12.0 - 15.0 g/dL   HCT 34.3 (L) 36.0 - 46.0 %   MCV 90.0 80.0 - 100.0 fL   MCH 30.7 26.0 - 34.0 pg   MCHC 34.1 30.0 - 36.0 g/dL   RDW 13.1 11.5 - 15.5 %   Platelets 264 150 - 400 K/uL   nRBC 0.0 0.0 - 0.2 %  Comprehensive metabolic panel     Status: Abnormal   Collection Time: 06/22/22 12:43 PM  Result Value Ref Range   Sodium 136 135 - 145 mmol/L   Potassium 3.8 3.5 - 5.1 mmol/L   Chloride 104 98 - 111 mmol/L   CO2 24 22 - 32 mmol/L   Glucose, Bld 117 (H) 70 - 99 mg/dL   BUN 5 (L) 6 - 20 mg/dL   Creatinine, Ser 0.74 0.44 - 1.00 mg/dL   Calcium 8.6 (L) 8.9 - 10.3 mg/dL   Total Protein 5.8 (L) 6.5 - 8.1 g/dL   Albumin 2.7 (L) 3.5 - 5.0 g/dL   AST 19 15 - 41 U/L   ALT 14 0 - 44 U/L   Alkaline Phosphatase 86 38 - 126 U/L   Total Bilirubin 0.3 0.3 - 1.2 mg/dL   GFR, Estimated >60 >60 mL/min   Anion gap 8 5 - 15   Patient Vitals for the past 24 hrs:  BP Temp Temp src Pulse Resp SpO2 Height Weight  06/22/22 1523 107/66 -- -- 88 -- -- -- --  06/22/22 1435 -- -- -- -- -- 100 % -- --  06/22/22 1430 -- -- -- -- -- 100 % -- --  06/22/22 1425 -- -- -- -- -- 100 % -- --  06/22/22 1420 -- -- -- -- -- 100 % -- --  06/22/22 1415 -- -- -- -- -- 100 % -- --  06/22/22 1410 -- -- -- -- -- 100 % -- --  06/22/22 1409 -- -- -- -- -- 100 % -- --  06/22/22 1405 -- -- -- -- -- 100 % -- --  06/22/22 1400 -- -- -- -- -- 100 % -- --  06/22/22 1355 -- -- -- -- -- 100 % -- --  06/22/22 1350 -- -- -- -- -- 99 % -- --  06/22/22 1345 -- -- -- -- -- 100 % -- --  06/22/22 1340 -- -- -- -- -- 99 % -- --  06/22/22 1335 -- -- -- -- -- 99 % -- --  06/22/22 1330 -- -- -- -- -- 99 % -- --  06/22/22 1325 -- -- -- -- -- 99 % -- --  06/22/22 1320 -- -- -- -- -- 100 % -- --   06/22/22 1315 -- -- -- -- -- 100 % -- --  06/22/22 1310 -- -- -- -- -- 100 % -- --  06/22/22 1305 -- -- -- -- -- 100 % -- --  06/22/22 1300 -- -- -- -- -- 100 % -- --  06/22/22 1255 -- -- -- -- -- 100 % -- --  06/22/22 1250 -- -- -- -- -- 100 % -- --  06/22/22 1245 -- -- -- -- -- 100 % -- --  06/22/22 1240 -- -- -- -- -- 100 % -- --  06/22/22 1235 -- -- -- -- -- 99 % -- --  06/22/22 1230 123/72 -- -- (!) 101 -- 99 % -- --  06/22/22 1225 -- -- -- -- -- 100 % -- --  06/22/22 1220 -- -- -- -- -- 98 % -- --  06/22/22 1215 -- -- -- -- -- 98 % -- --  06/22/22 1210 -- -- -- -- -- 98 % -- --  06/22/22 1207 112/76 -- -- 99 -- -- -- --  06/22/22 1205 -- -- -- -- -- 98 % -- --  06/22/22 1153 119/74 98.8 F (37.1 C) Oral 98 18 99 % -- --  06/22/22 1148 -- -- -- -- -- -- _0  (1.549 m) --  06/22/22 1147 -- -- -- -- -- -- -- 74.4 kg    MDM 5+ Ketones otherwise normal  Hbg and Platelet count normal for pregnancy. Low suspicion for HELLP  - HA improved from 6-->1.  - PCR still pending s/p 2hrs. Patient agreeable to return to MAU for concerning results. Based on Normotensive BPs, headache improved and normal PreE labs, low suspicion for preE.  - Plan for discharge   Assessment and Plan   1. [redacted] weeks gestation of pregnancy   2. History of preterm delivery, currently pregnant in first trimester   3. Supervision of high risk pregnancy, antepartum   4. Fetal echogenic intracardiac focus on prenatal ultrasound   5. Headache in pregnancy, antepartum, third trimester    - Reviewed that headaches and migraines can be a normal discomfort of pregnancy.  - Worsening signs and return precautions reviewed.  - Preterm labor precautions reviewed.  - FHT appropriate for gestational age upon time of discharge.  - Patient discharged home in stable condition and may return to MAU as needed.   Jacquiline Doe, MSN CNM  06/22/2022, 3:25 PM

## 2022-06-24 NOTE — L&D Delivery Note (Signed)
Operative Delivery Note  Indication for operative vaginal delivery: non-reassuring fetal heart tones  Patient was examined and found to be fully dilated with fetal station of +2.  Patient's bladder was noted to be empty, and there were no known fetal contraindications to operative vaginal delivery. FHR tracing remarkable for prolonged late decelerations  Risks of vacuum assistance were discussed in detail, including but not limited to, bleeding, infection, damage to maternal tissues, fetal cephalohematoma, inability to effect vaginal delivery of the head or shoulder dystocia that cannot be resolved by established maneuvers and need for emergency cesarean section.  Patient gave verbal consent.  The soft vacuum cup was positioned over the sagittal suture 3 cm anterior to posterior fontanelle.  Pressure was then increased to 500 mmHg, and the patient was instructed to push.  Pulling was administered along the pelvic curve while patient was pushing; there were 2 contractions and 3 popoffs.  Vacuum was reduced in between contractions.  The infant was then delivered atraumatically, noted to be a viable female/female infant, Apgars of 8 and 9.  Neonatology present for delivery.  There was spontaneous placental delivery, intact with three-vessel cord.  First degree perineal laceration noted requiring repair with 3-0 monocryl in the usual fashion. [id number], epidural anesthesia.   Sponge, instrument and needle counts were correct x 2.  The patient and baby were stable after delivery and remained in couplet care, with plans to transfer later to postpartum unit  Shelda Pal, [place] Fellow, Faculty practice [place] for [place] 08/16/22  6:34 AM

## 2022-06-26 ENCOUNTER — Encounter (HOSPITAL_COMMUNITY): Payer: Self-pay | Admitting: Obstetrics and Gynecology

## 2022-06-26 ENCOUNTER — Other Ambulatory Visit: Payer: Self-pay

## 2022-06-26 ENCOUNTER — Inpatient Hospital Stay (HOSPITAL_COMMUNITY)
Admission: AD | Admit: 2022-06-26 | Discharge: 2022-06-27 | Disposition: A | Discharge status: Home / Self Care | Payer: BC Managed Care – PPO | Attending: Obstetrics and Gynecology | Admitting: Obstetrics and Gynecology

## 2022-06-26 DIAGNOSIS — O4700 False labor before 37 completed weeks of gestation, unspecified trimester: Secondary | ICD-10-CM | POA: Insufficient documentation

## 2022-06-26 DIAGNOSIS — O4703 False labor before 37 completed weeks of gestation, third trimester: Secondary | ICD-10-CM | POA: Insufficient documentation

## 2022-06-26 DIAGNOSIS — Z3A31 31 weeks gestation of pregnancy: Secondary | ICD-10-CM | POA: Insufficient documentation

## 2022-06-26 DIAGNOSIS — R824 Acetonuria: Secondary | ICD-10-CM

## 2022-06-26 DIAGNOSIS — O26893 Other specified pregnancy related conditions, third trimester: Secondary | ICD-10-CM | POA: Diagnosis not present

## 2022-06-26 LAB — URINALYSIS, ROUTINE W REFLEX MICROSCOPIC
Bilirubin Urine: NEGATIVE
Glucose, UA: NEGATIVE mg/dL
Hgb urine dipstick: NEGATIVE
Ketones, ur: 80 mg/dL — AB
Leukocytes,Ua: NEGATIVE
Nitrite: NEGATIVE
Protein, ur: NEGATIVE mg/dL
Specific Gravity, Urine: 1.018 (ref 1.005–1.030)
pH: 5 (ref 5.0–8.0)

## 2022-06-26 MED ORDER — NIFEDIPINE 10 MG PO CAPS
10.0000 mg | ORAL_CAPSULE | ORAL | Status: DC | PRN
  Administered 2022-06-26: 10 mg via ORAL
  Filled 2022-06-26 (×2): qty 1

## 2022-06-26 NOTE — MAU Note (Signed)
.  Karina Torres is a 37 y.o. at [redacted]w[redacted]d here in MAU reporting: contractions since 4pm q. 4-7 min that have improved after eating and are now irregular and less intense per pt. Pt. Denies VB, LOF, RUQ pain, and endorses positive FM.  LMP: [place] Onset of complaint: 1/3 at 1600 Pain score: 6/10 --> 4/10 Vitals:   06/26/22 2208 06/26/22 2210  BP: 109/72   Pulse: (!) 102   Resp: 20   Temp: 98.3 F (36.8 C)   SpO2:  98%     FHT:135 Lab orders placed from triage:  UA

## 2022-06-26 NOTE — MAU Provider Note (Signed)
Chief Complaint:  Contractions   Event Date/Time   First Provider Initiated Contact with Patient 06/26/22 2224     HPI: Karina Torres is a 37 y.o. G3P1102 at 49w6dho presents to maternity admissions reporting contractiosn since this afternoon.  They improved after eating.  Less intense. No history of preterm labor.  . She reports good fetal movement, denies LOF, vaginal bleeding, vaginal itching/burning, urinary symptoms, h/a, dizziness, n/v, diarrhea, constipation or fever/chills.   Abdominal Pain This is a new problem. The current episode started today. The problem occurs intermittently. The problem has been gradually improving. The quality of the pain is cramping. The abdominal pain does not radiate. Pertinent negatives include no dysuria, fever or headaches. The pain is relieved by Nothing. Treatments tried: eating. The treatment provided moderate relief.   RN Note: Karina CIESLAKis a 37y.o. at 374w6dere in MAU reporting: contractions since 4pm q. 4-7 min that have improved after eating and are now irregular and less intense per pt. Pt. Denies VB, LOF, RUQ pain, and endorses positive FM.  [place]: 1/3 at 1600 Pain score: 6/10 --> 4/10  Past Medical History: Past Medical History:  Diagnosis Date   Hx of chlamydia infection    Hx of HELLP syndrome, currently pregnant    Hypertension     Past obstetric history: OB History  Gravida Para Term Preterm AB Living  _0 SAB IAB Ectopic Multiple Live Births        0 2    # Outcome Date GA Lbr Len/2nd Weight Sex Delivery Anes PTL Lv  3 Current           2 Term 05/22/15 4014w6d:21 / 00:07 3118 g F VBAC EPI  LIV  1 Preterm 2009 [id number] g M CS-LTranv   LIV     Birth Comments: HELLP     Complications: Preeclampsia    Past Surgical History: Past Surgical History:  Procedure Laterality Date   CESAREAN SECTION     COLPOSCOPY      Family History: Family History  Problem Relation Age of Onset    Asthma Mother    Diabetes Mother    Learning disabilities Son    Diabetes Maternal Grandmother    Diabetes Maternal Grandfather    Diabetes Father    Alcohol abuse Neg Hx    Arthritis Neg Hx    Birth defects Neg Hx    Cancer Neg Hx    COPD Neg Hx    Depression Neg Hx    Drug abuse Neg Hx    Early death Neg Hx    Hearing loss Neg Hx    Heart disease Neg Hx    Hyperlipidemia Neg Hx    Hypertension Neg Hx    Kidney disease Neg Hx    Mental illness Neg Hx    Mental retardation Neg Hx    Miscarriages / Stillbirths Neg Hx    Stroke Neg Hx    Vision loss Neg Hx    Varicose Veins Neg Hx     Social History: Social History   Tobacco Use   Smoking status: Never   Smokeless tobacco: Never  Vaping Use   Vaping Use: Former  Substance Use Topics   Alcohol use: Not Currently    Comment: occasional use- not during pregnancy   Drug use: Not Currently    Types: Marijuana    Comment: every day user "1 or  2 a day"    Allergies: No Known Allergies  Meds:  Medications Prior to Admission  Medication Sig Dispense Refill Last Dose   acetaminophen-caffeine (EXCEDRIN TENSION HEADACHE) 500-65 MG TABS per tablet Take 2 tablets by mouth every 6 (six) hours. 60 tablet 0 06/26/2022   Blood Pressure Monitoring (BLOOD PRESSURE KIT) DEVI 1 each by Does not apply route once a week. 1 each 0 06/26/2022   Prenatal Vit-Fe Fumarate-FA (PRENATAL PO) Take by mouth.   Past Week    I have reviewed patient's Past Medical Hx, Surgical Hx, Family Hx, Social Hx, medications and allergies.   ROS:  Review of Systems  Constitutional:  Negative for fever.  Gastrointestinal:  Positive for abdominal pain.  Genitourinary:  Negative for dysuria.  Neurological:  Negative for headaches.   Other systems negative  Physical Exam  Patient Vitals for the past 24 hrs:  BP Temp Temp src Pulse Resp SpO2 Height Weight  06/26/22 2215 111/73 -- -- 100 -- 99 % -- --  06/26/22 2210 -- -- -- -- -- 98 % _0  (1.549 m) 77  kg  06/26/22 2208 109/72 98.3 F (36.8 C) Oral (!) 102 20 -- -- --   Constitutional: Well-developed, well-nourished female in no acute distress.  Cardiovascular: normal rate  Respiratory: normal effort GI: Abd soft, non-tender, gravid appropriate for gestational age.   No rebound or guarding. MS: Extremities nontender, no edema, normal ROM Neurologic: Alert and oriented x 4.  GU: Neg CVAT.  PELVIC EXAM: Dilation: Closed Effacement (%): Thick Station: Ballotable Exam by:: Hansel Torres, CNM    FHT:  Baseline 140 , moderate variability, accelerations present, no decelerations Contractions:  Irregular     Labs: Results for orders placed or performed during the hospital encounter of 06/26/22 (from the past 24 hour(s))  Urinalysis, Routine w reflex microscopic Urine, Clean Catch     Status: Abnormal   Collection Time: 06/26/22 10:21 PM  Result Value Ref Range   Color, Urine YELLOW YELLOW   APPearance CLEAR CLEAR   Specific Gravity, Urine 1.018 1.005 - 1.030   pH 5.0 5.0 - 8.0   Glucose, UA NEGATIVE NEGATIVE mg/dL   Hgb urine dipstick NEGATIVE NEGATIVE   Bilirubin Urine NEGATIVE NEGATIVE   Ketones, ur 80 (A) NEGATIVE mg/dL   Protein, ur NEGATIVE NEGATIVE mg/dL   Nitrite NEGATIVE NEGATIVE   Leukocytes,Ua NEGATIVE NEGATIVE    A/Positive/-- (08/15 1052)  Imaging:  No results found.  MAU Course/MDM: I have reviewed the triage vital signs and the nursing notes.   Pertinent labs & imaging results that were available during my care of the patient were reviewed by me and considered in my medical decision making (see chart for details).      I have reviewed her medical records including past results, notes and treatments.   I have ordered labs and reviewed results. UA showed good hydration NST reviewed, reactive  Treatments in MAU included Procardia x 1 dose, which diminished contractions,  Patient states does not feel them anymore..    Assessment: Single IUP at  2w0dPreterm uterine contractions Ketonuria  Plan: Discharge home Preterm Labor precautions and fetal kick counts Encouraged good hydration Follow up in Office for prenatal visits and recheck Encouraged to return if she develops worsening of symptoms, increase in pain, fever, or other concerning symptoms.   Pt stable at time of discharge.  MHansel FeinsteinCNM, MSN Certified Nurse-Midwife 06/26/2022 10:24 PM

## 2022-06-27 ENCOUNTER — Ambulatory Visit (INDEPENDENT_AMBULATORY_CARE_PROVIDER_SITE_OTHER): Payer: BC Managed Care – PPO | Admitting: Family Medicine

## 2022-06-27 VITALS — BP 107/74 | HR 97 | Wt 165.7 lb

## 2022-06-27 DIAGNOSIS — Z3A31 31 weeks gestation of pregnancy: Secondary | ICD-10-CM

## 2022-06-27 DIAGNOSIS — O09891 Supervision of other high risk pregnancies, first trimester: Secondary | ICD-10-CM

## 2022-06-27 DIAGNOSIS — O0993 Supervision of high risk pregnancy, unspecified, third trimester: Secondary | ICD-10-CM

## 2022-06-27 DIAGNOSIS — Z98891 History of uterine scar from previous surgery: Secondary | ICD-10-CM

## 2022-06-27 DIAGNOSIS — O4700 False labor before 37 completed weeks of gestation, unspecified trimester: Secondary | ICD-10-CM | POA: Insufficient documentation

## 2022-06-27 DIAGNOSIS — O0990 Supervision of high risk pregnancy, unspecified, unspecified trimester: Secondary | ICD-10-CM

## 2022-06-27 DIAGNOSIS — R824 Acetonuria: Secondary | ICD-10-CM

## 2022-06-27 DIAGNOSIS — O4703 False labor before 37 completed weeks of gestation, third trimester: Secondary | ICD-10-CM

## 2022-06-27 DIAGNOSIS — Z3A32 32 weeks gestation of pregnancy: Secondary | ICD-10-CM

## 2022-06-27 DIAGNOSIS — O09893 Supervision of other high risk pregnancies, third trimester: Secondary | ICD-10-CM

## 2022-06-27 NOTE — Progress Notes (Signed)
   PRENATAL VISIT NOTE  Subjective:  Karina Torres is a 37 y.o. G3P1102 at 101w0d being seen today for ongoing prenatal care.  She is currently monitored for the following issues for this low-risk pregnancy and has History of HELLP syndrome, currently pregnant; History of C-section; Supervision of high risk pregnancy, antepartum; AMA (advanced maternal age) multigravida 22+; History of preterm delivery, currently pregnant in first trimester; SMA carrier; Fetal echogenic intracardiac focus on prenatal ultrasound; and Preterm uterine contractions on their problem list.  Patient reports no bleeding, no leaking, occasional contractions, and reports contractions last night, went to the MAU and received procardia and was discharged .  Contractions: Irritability. Vag. Bleeding: None.  Movement: Present. Denies leaking of fluid.   The following portions of the patient's history were reviewed and updated as appropriate: allergies, current medications, past family history, past medical history, past social history, past surgical history and problem list.   Objective:   Vitals:   06/27/22 0825  BP: 107/74  Pulse: 97  Weight: 165 lb 11.2 oz (75.2 kg)    Fetal Status: Fetal Heart Rate (bpm): 140   Movement: Present     General:  Alert, oriented and cooperative. Patient is in no acute distress.  Skin: Skin is warm and dry. No rash noted.   Cardiovascular: Normal heart rate noted  Respiratory: Normal respiratory effort, no problems with respiration noted  Abdomen: Soft, gravid, appropriate for gestational age.  Pain/Pressure: Present     Pelvic: Cervical exam deferred        Extremities: Normal range of motion.  Edema: Trace  Mental Status: Normal mood and affect. Normal behavior. Normal judgment and thought content.   Assessment and Plan:  Pregnancy: G3P1102 at [redacted]w[redacted]d 1. Supervision of high risk pregnancy, antepartum Continue routine prenatal care.  Rh+.  She has received her Tdap vaccine.   Follow-up in 2 weeks.  2. History of preterm delivery, currently pregnant in first trimester Due to HELLP  3. History of C-section Patient continues to desire TOLAC.  TOLAC papers signed 12/7  4. [redacted] weeks gestation of pregnancy Continue routine prenatal care  5.  History of HELLP syndrome, currently pregnant BPs within normal limits today.  Continuing low-dose aspirin.  Preterm labor symptoms and general obstetric precautions including but not limited to vaginal bleeding, contractions, leaking of fluid and fetal movement were reviewed in detail with the patient. Please refer to After Visit Summary for other counseling recommendations.   No follow-ups on file.  Future Appointments  Date Time Provider [place]  07/11/2022 10:15 AM Johnston Ebbs, NP [facility]    Concepcion Living, MD

## 2022-07-05 ENCOUNTER — Encounter: Payer: Self-pay | Admitting: General Practice

## 2022-07-11 ENCOUNTER — Other Ambulatory Visit: Payer: Self-pay

## 2022-07-11 ENCOUNTER — Ambulatory Visit (INDEPENDENT_AMBULATORY_CARE_PROVIDER_SITE_OTHER): Payer: BC Managed Care – PPO | Admitting: Student

## 2022-07-11 VITALS — BP 116/82 | HR 77 | Wt 169.0 lb

## 2022-07-11 DIAGNOSIS — Z0289 Encounter for other administrative examinations: Secondary | ICD-10-CM

## 2022-07-11 DIAGNOSIS — Z3A34 34 weeks gestation of pregnancy: Secondary | ICD-10-CM

## 2022-07-11 DIAGNOSIS — O0990 Supervision of high risk pregnancy, unspecified, unspecified trimester: Secondary | ICD-10-CM

## 2022-07-11 DIAGNOSIS — O09299 Supervision of pregnancy with other poor reproductive or obstetric history, unspecified trimester: Secondary | ICD-10-CM

## 2022-07-11 DIAGNOSIS — O09293 Supervision of pregnancy with other poor reproductive or obstetric history, third trimester: Secondary | ICD-10-CM

## 2022-07-11 DIAGNOSIS — O09522 Supervision of elderly multigravida, second trimester: Secondary | ICD-10-CM

## 2022-07-11 DIAGNOSIS — Z98891 History of uterine scar from previous surgery: Secondary | ICD-10-CM

## 2022-07-11 DIAGNOSIS — O09523 Supervision of elderly multigravida, third trimester: Secondary | ICD-10-CM

## 2022-07-11 NOTE — Progress Notes (Signed)
   PRENATAL VISIT NOTE  Subjective:  Karina Torres is a 37 y.o. G3P1102 at [redacted]w[redacted]d being seen today for ongoing prenatal care.  She is currently monitored for the following issues for this low-risk pregnancy and has History of HELLP syndrome, currently pregnant; History of C-section; Supervision of high risk pregnancy, antepartum; AMA (advanced maternal age) multigravida 65+; History of preterm delivery, currently pregnant in first trimester; SMA carrier; Fetal echogenic intracardiac focus on prenatal ultrasound; and Preterm uterine contractions on their problem list.  Patient reports  pelvic discomfort .  Contractions: Irritability. Contractions 1-2x/day and sometimes not any .  Movement: Present. Denies leaking of fluid.   The following portions of the patient's history were reviewed and updated as appropriate: allergies, current medications, past family history, past medical history, past social history, past surgical history and problem list.   Objective:   Vitals:   07/11/22 1034  BP: 116/82  Pulse: 77  Weight: 169 lb (76.7 kg)    Fetal Status: Fetal Heart Rate (bpm): 152   Movement: Present     General:  Alert, oriented and cooperative. Patient is in no acute distress.  Skin: Skin is warm and dry. No rash noted.   Cardiovascular: Normal heart rate noted  Respiratory: Normal respiratory effort, no problems with respiration noted  Abdomen: Soft, gravid, appropriate for gestational age.  Pain/Pressure: Present     Pelvic: Cervical exam deferred        Extremities: Normal range of motion.     Mental Status: Normal mood and affect. Normal behavior. Normal judgment and thought content.   Assessment and Plan:  Pregnancy: G3P1102 at [redacted]w[redacted]d 1. Supervision of high risk pregnancy, antepartum - Recommend support belt for support of pelvic floor  - Vigorous and frequent fetal movement  2. [redacted] weeks gestation of pregnancy - Continue routine follow-up, swabs next visit  3. History of  C-section -Patient continues to desire TOLAC  4. Multigravida of advanced maternal age in second trimester - anatomy and genetic screening normal  5. History of HELLP syndrome, currently pregnant - BP normal today  Preterm labor symptoms and general obstetric precautions including but not limited to vaginal bleeding, contractions, leaking of fluid and fetal movement were reviewed in detail with the patient. Please refer to After Visit Summary for other counseling recommendations.   Return in about 2 weeks (around 07/25/2022) for HOB/GBS, IN-PERSON.  Future Appointments  Date Time Provider [place]  07/17/2022  9:35 AM Helaine Chess [facility]    Johnston Ebbs, NP

## 2022-07-17 ENCOUNTER — Encounter: Payer: BC Managed Care – PPO | Admitting: Certified Nurse Midwife

## 2022-07-26 ENCOUNTER — Other Ambulatory Visit (HOSPITAL_COMMUNITY)
Admission: RE | Admit: 2022-07-26 | Discharge: 2022-07-26 | Disposition: A | Discharge status: Home / Self Care | Payer: BC Managed Care – PPO | Source: Ambulatory Visit | Attending: Certified Nurse Midwife | Admitting: Certified Nurse Midwife

## 2022-07-26 ENCOUNTER — Other Ambulatory Visit: Payer: Self-pay

## 2022-07-26 ENCOUNTER — Encounter: Payer: Self-pay | Admitting: Gastroenterology

## 2022-07-26 ENCOUNTER — Ambulatory Visit (INDEPENDENT_AMBULATORY_CARE_PROVIDER_SITE_OTHER): Payer: BC Managed Care – PPO | Admitting: Obstetrics and Gynecology

## 2022-07-26 VITALS — BP 114/79 | HR 96 | Wt 173.6 lb

## 2022-07-26 DIAGNOSIS — O0990 Supervision of high risk pregnancy, unspecified, unspecified trimester: Secondary | ICD-10-CM | POA: Insufficient documentation

## 2022-07-26 DIAGNOSIS — O09299 Supervision of pregnancy with other poor reproductive or obstetric history, unspecified trimester: Secondary | ICD-10-CM

## 2022-07-26 DIAGNOSIS — O09523 Supervision of elderly multigravida, third trimester: Secondary | ICD-10-CM

## 2022-07-26 DIAGNOSIS — O09293 Supervision of pregnancy with other poor reproductive or obstetric history, third trimester: Secondary | ICD-10-CM

## 2022-07-26 DIAGNOSIS — O283 Abnormal ultrasonic finding on antenatal screening of mother: Secondary | ICD-10-CM

## 2022-07-26 DIAGNOSIS — R1012 Left upper quadrant pain: Secondary | ICD-10-CM

## 2022-07-26 DIAGNOSIS — O0993 Supervision of high risk pregnancy, unspecified, third trimester: Secondary | ICD-10-CM

## 2022-07-26 DIAGNOSIS — Z98891 History of uterine scar from previous surgery: Secondary | ICD-10-CM

## 2022-07-26 DIAGNOSIS — R898 Other abnormal findings in specimens from other organs, systems and tissues: Secondary | ICD-10-CM

## 2022-07-26 DIAGNOSIS — Z3A36 36 weeks gestation of pregnancy: Secondary | ICD-10-CM

## 2022-07-26 DIAGNOSIS — Z0289 Encounter for other administrative examinations: Secondary | ICD-10-CM

## 2022-07-26 NOTE — Progress Notes (Signed)
   PRENATAL VISIT NOTE  Subjective:  Karina Torres is a 37 y.o. G3P1102 at [redacted]w[redacted]d being seen today for ongoing prenatal care.  She is currently monitored for the following issues for this high-risk pregnancy and has History of HELLP syndrome, currently pregnant; History of C-section; Supervision of high risk pregnancy, antepartum; AMA (advanced maternal age) multigravida 25+; History of preterm delivery, currently pregnant in first trimester; SMA carrier; Fetal echogenic intracardiac focus on prenatal ultrasound; and Preterm uterine contractions on their problem list.  Patient doing well with no acute concerns today. She reports  upper abdominal pain which she has had since early in the pregnancy .  The pain is always present about an hour after she eats something  Contractions: Irritability. Vag. Bleeding: None.  Movement: Present. Denies leaking of fluid.   The following portions of the patient's history were reviewed and updated as appropriate: allergies, current medications, past family history, past medical history, past social history, past surgical history and problem list. Problem list updated.  Objective:   Vitals:   07/26/22 0947  BP: 114/79  Pulse: 96  Weight: 173 lb 9.6 oz (78.7 kg)    Fetal Status: Fetal Heart Rate (bpm): 130 Fundal Height: 36 cm Movement: Present     General:  Alert, oriented and cooperative. Patient is in no acute distress.  Skin: Skin is warm and dry. No rash noted.   Cardiovascular: Normal heart rate noted  Respiratory: Normal respiratory effort, no problems with respiration noted  Abdomen: Soft, gravid, appropriate for gestational age.  Pain/Pressure: Present     Pelvic: Cervical exam deferred Dilation: Fingertip Effacement (%): 50 Station: Ballotable  Extremities: Normal range of motion.  Edema: None  Mental Status:  Normal mood and affect. Normal behavior. Normal judgment and thought content.   Assessment and Plan:  Pregnancy: G3P1102 at  [redacted]w[redacted]d  1. [redacted] weeks gestation of pregnancy   2. Supervision of high risk pregnancy, antepartum Continue routine prenatal care  - Strep Gp B NAA - GC/Chlamydia probe amp ([place])not at [facility]  3. SMA carrier   4. History of C-section Pt desires TOLAC, VBAC consent previously signed  5. History of HELLP syndrome, currently pregnant BP WNL  6. Multigravida of advanced maternal age in third trimester   7. Fetal echogenic intracardiac focus on prenatal ultrasound   8. Left upper quadrant abdominal pain Will initiate GI referral so the patient can hopefully be seen after the conclusion of her pregnancy Pt has tried and failed protonix, maalox suggested  for care prn - Ambulatory referral to Gastroenterology  Preterm labor symptoms and general obstetric precautions including but not limited to vaginal bleeding, contractions, leaking of fluid and fetal movement were reviewed in detail with the patient.  Please refer to After Visit Summary for other counseling recommendations.   Return in about 1 week (around 08/02/2022) for ROB, in person.   Lynnda Shields, MD Faculty Attending Center for [facility]

## 2022-07-28 LAB — STREP GP B NAA: Strep Gp B NAA: NEGATIVE

## 2022-07-29 LAB — GC/CHLAMYDIA PROBE AMP (~~LOC~~) NOT AT ARMC
Chlamydia: NEGATIVE
Comment: NEGATIVE
Comment: NORMAL
Neisseria Gonorrhea: NEGATIVE

## 2022-08-06 ENCOUNTER — Ambulatory Visit (INDEPENDENT_AMBULATORY_CARE_PROVIDER_SITE_OTHER): Payer: BC Managed Care – PPO

## 2022-08-06 ENCOUNTER — Other Ambulatory Visit: Payer: Self-pay

## 2022-08-06 VITALS — BP 115/77 | HR 85 | Temp 98.3°F

## 2022-08-06 DIAGNOSIS — O0990 Supervision of high risk pregnancy, unspecified, unspecified trimester: Secondary | ICD-10-CM

## 2022-08-06 DIAGNOSIS — O0993 Supervision of high risk pregnancy, unspecified, third trimester: Secondary | ICD-10-CM

## 2022-08-06 DIAGNOSIS — Z3A37 37 weeks gestation of pregnancy: Secondary | ICD-10-CM

## 2022-08-06 NOTE — Patient Instructions (Signed)

## 2022-08-06 NOTE — Progress Notes (Signed)
   PRENATAL VISIT NOTE  Subjective:  Karina Torres is a 37 y.o. [id number] at [redacted]w[redacted]d being seen today for ongoing prenatal care.  She is currently monitored for the following issues for this high-risk pregnancy and has History of HELLP syndrome, currently pregnant; History of C-section; Supervision of high risk pregnancy, antepartum; AMA (advanced maternal age) multigravida 73+; History of preterm delivery, currently pregnant in first trimester; SMA carrier; Fetal echogenic intracardiac focus on prenatal ultrasound; and Preterm uterine contractions on their problem list.  Patient reports no complaints.  Contractions: Irregular. Vag. Bleeding: None.  Movement: Present. Denies leaking of fluid.   The following portions of the patient's history were reviewed and updated as appropriate: allergies, current medications, past family history, past medical history, past social history, past surgical history and problem list.   Objective:   Vitals:   08/06/22 0831  BP: 115/77  Pulse: 85  Temp: 98.3 F (36.8 C)    Fetal Status: Fetal Heart Rate (bpm): 143 Fundal Height: 37 cm Movement: Present     General:  Alert, oriented and cooperative. Patient is in no acute distress.  Skin: Skin is warm and dry. No rash noted.   Cardiovascular: Normal heart rate noted  Respiratory: Normal respiratory effort, no problems with respiration noted  Abdomen: Soft, gravid, appropriate for gestational age.  Pain/Pressure: Absent     Pelvic: Cervical exam deferred        Extremities: Normal range of motion.     Mental Status: Normal mood and affect. Normal behavior. Normal judgment and thought content.   Assessment and Plan:  Pregnancy: G3P1102 at [redacted]w[redacted]d 1. Supervision of high risk pregnancy, antepartum -Routine care -Reviewed negative GBS -Patient ready for labor! Anticipatory guidance reviewed for labor and when to present to hospital  2. [redacted] weeks gestation of pregnancy   Term labor symptoms and general  obstetric precautions including but not limited to vaginal bleeding, contractions, leaking of fluid and fetal movement were reviewed in detail with the patient. Please refer to After Visit Summary for other counseling recommendations.   Return in about 1 week (around 08/13/2022) for Return OB visit.  Future Appointments  Date Time Provider [place]  08/13/2022 10:15 AM Marcy Salvo [facility]  08/20/2022  9:15 AM Darliss Cheney, MD [facility]  08/27/2022  8:15 AM Tresea Mall, CNM [facility]  08/27/2022 10:30 AM Thornton Park, MD [facility] [place], [place] 08/06/22 8:35 AM

## 2022-08-13 ENCOUNTER — Other Ambulatory Visit: Payer: Self-pay

## 2022-08-13 ENCOUNTER — Ambulatory Visit (INDEPENDENT_AMBULATORY_CARE_PROVIDER_SITE_OTHER): Payer: BC Managed Care – PPO | Admitting: Family Medicine

## 2022-08-13 VITALS — BP 123/69 | HR 83 | Wt 181.0 lb

## 2022-08-13 DIAGNOSIS — O0990 Supervision of high risk pregnancy, unspecified, unspecified trimester: Secondary | ICD-10-CM

## 2022-08-13 DIAGNOSIS — O09523 Supervision of elderly multigravida, third trimester: Secondary | ICD-10-CM

## 2022-08-13 DIAGNOSIS — O0993 Supervision of high risk pregnancy, unspecified, third trimester: Secondary | ICD-10-CM

## 2022-08-13 DIAGNOSIS — Z3A38 38 weeks gestation of pregnancy: Secondary | ICD-10-CM

## 2022-08-13 DIAGNOSIS — Z98891 History of uterine scar from previous surgery: Secondary | ICD-10-CM

## 2022-08-13 NOTE — Progress Notes (Signed)
    PRENATAL VISIT NOTE  Subjective:  Karina Torres is a 37 y.o. G3P1102 at 34w5dbeing seen today for ongoing prenatal care.  She is currently monitored for the following issues for this high-risk pregnancy and has History of HELLP syndrome, currently pregnant; History of C-section; Supervision of high risk pregnancy, antepartum; AMA (advanced maternal age) multigravida 379+ History of preterm delivery, currently pregnant in first trimester; SMA carrier; Fetal echogenic intracardiac focus on prenatal ultrasound; and Preterm uterine contractions on their problem list.  Patient reports no complaints.  Contractions: Irregular. Vag. Bleeding: None.  Movement: Present. Denies leaking of fluid.   The following portions of the patient's history were reviewed and updated as appropriate: allergies, current medications, past family history, past medical history, past social history, past surgical history and problem list.   Objective:   Vitals:   08/13/22 1025  BP: 123/69  Pulse: 83  Weight: 181 lb (82.1 kg)    Fetal Status: Fetal Heart Rate (bpm): 154 Fundal Height: 38 cm Movement: Present  Presentation: Vertex  General:  Alert, oriented and cooperative. Patient is in no acute distress.  Skin: Skin is warm and dry. No rash noted.   Cardiovascular: Normal heart rate noted  Respiratory: Normal respiratory effort, no problems with respiration noted  Abdomen: Soft, gravid, appropriate for gestational age.  Pain/Pressure: Present     Pelvic: Cervical exam performed in the presence of a chaperone Dilation: 1.5 Effacement (%): 70 Station: -2  Extremities: Normal range of motion.     Mental Status: Normal mood and affect. Normal behavior. Normal judgment and thought content.   Assessment and Plan:  Pregnancy: G3P1102 at 341w5d. Supervision of high risk pregnancy, antepartum No acute concerns.   2. History of C-section VBAC desires. Consent formed signed in prior visit.   3. Multigravida of  advanced maternal age in third trimester  4. [redacted] weeks gestation of pregnancy Follow up in 1 week.   Term labor symptoms and general obstetric precautions including but not limited to vaginal bleeding, contractions, leaking of fluid and fetal movement were reviewed in detail with the patient. Please refer to After Visit Summary for other counseling recommendations.   Return in about 1 week (around 08/20/2022) for HROB follow up.  Future Appointments  Date Time Provider [place]2/26/2024  1:15 PM ArWoodroe ModeMD [facility]3/10/2022  8:15 AM HoTresea MallCNM [facility]3/10/2022 10:30 AM BeThornton ParkMD [facility]  Arhianna Ebey Autry-Lott, DO

## 2022-08-15 ENCOUNTER — Inpatient Hospital Stay (HOSPITAL_COMMUNITY): Payer: BC Managed Care – PPO | Admitting: Anesthesiology

## 2022-08-15 ENCOUNTER — Encounter (HOSPITAL_COMMUNITY): Payer: Self-pay | Admitting: Obstetrics and Gynecology

## 2022-08-15 ENCOUNTER — Other Ambulatory Visit: Payer: Self-pay

## 2022-08-15 ENCOUNTER — Inpatient Hospital Stay (HOSPITAL_COMMUNITY)
Admission: AD | Admit: 2022-08-15 | Discharge: 2022-08-17 | DRG: 807 | Disposition: A | Discharge status: Home / Self Care | Payer: BC Managed Care – PPO | Attending: Family Medicine | Admitting: Family Medicine

## 2022-08-15 DIAGNOSIS — O09529 Supervision of elderly multigravida, unspecified trimester: Secondary | ICD-10-CM

## 2022-08-15 DIAGNOSIS — O09891 Supervision of other high risk pregnancies, first trimester: Secondary | ICD-10-CM

## 2022-08-15 DIAGNOSIS — O4700 False labor before 37 completed weeks of gestation, unspecified trimester: Secondary | ICD-10-CM

## 2022-08-15 DIAGNOSIS — O26893 Other specified pregnancy related conditions, third trimester: Secondary | ICD-10-CM | POA: Diagnosis present

## 2022-08-15 DIAGNOSIS — O34219 Maternal care for unspecified type scar from previous cesarean delivery: Secondary | ICD-10-CM | POA: Diagnosis present

## 2022-08-15 DIAGNOSIS — O283 Abnormal ultrasonic finding on antenatal screening of mother: Secondary | ICD-10-CM | POA: Diagnosis not present

## 2022-08-15 DIAGNOSIS — O09523 Supervision of elderly multigravida, third trimester: Secondary | ICD-10-CM | POA: Diagnosis not present

## 2022-08-15 DIAGNOSIS — Z3A39 39 weeks gestation of pregnancy: Secondary | ICD-10-CM

## 2022-08-15 DIAGNOSIS — Z148 Genetic carrier of other disease: Secondary | ICD-10-CM

## 2022-08-15 DIAGNOSIS — Z8759 Personal history of other complications of pregnancy, childbirth and the puerperium: Secondary | ICD-10-CM

## 2022-08-15 DIAGNOSIS — O0990 Supervision of high risk pregnancy, unspecified, unspecified trimester: Principal | ICD-10-CM

## 2022-08-15 DIAGNOSIS — O34211 Maternal care for low transverse scar from previous cesarean delivery: Secondary | ICD-10-CM | POA: Diagnosis not present

## 2022-08-15 LAB — CBC
HCT: 36.3 % (ref 36.0–46.0)
Hemoglobin: 12.1 g/dL (ref 12.0–15.0)
MCH: 30 pg (ref 26.0–34.0)
MCHC: 33.3 g/dL (ref 30.0–36.0)
MCV: 89.9 fL (ref 80.0–100.0)
Platelets: 265 10*3/uL (ref 150–400)
RBC: 4.04 MIL/uL (ref 3.87–5.11)
RDW: 13.7 % (ref 11.5–15.5)
WBC: 10.7 10*3/uL — ABNORMAL HIGH (ref 4.0–10.5)
nRBC: 0 % (ref 0.0–0.2)

## 2022-08-15 LAB — COMPREHENSIVE METABOLIC PANEL
ALT: 14 U/L (ref 0–44)
AST: 20 U/L (ref 15–41)
Albumin: 2.6 g/dL — ABNORMAL LOW (ref 3.5–5.0)
Alkaline Phosphatase: 184 U/L — ABNORMAL HIGH (ref 38–126)
Anion gap: 10 (ref 5–15)
BUN: 6 mg/dL (ref 6–20)
CO2: 21 mmol/L — ABNORMAL LOW (ref 22–32)
Calcium: 8.7 mg/dL — ABNORMAL LOW (ref 8.9–10.3)
Chloride: 104 mmol/L (ref 98–111)
Creatinine, Ser: 0.72 mg/dL (ref 0.44–1.00)
GFR, Estimated: >60 mL/min (ref >60)
Glucose, Bld: 92 mg/dL (ref 70–99)
Potassium: 4.1 mmol/L (ref 3.5–5.1)
Sodium: 135 mmol/L (ref 135–145)
Total Bilirubin: 0.4 mg/dL (ref 0.3–1.2)
Total Protein: 6.2 g/dL — ABNORMAL LOW (ref 6.5–8.1)

## 2022-08-15 LAB — URINALYSIS, ROUTINE W REFLEX MICROSCOPIC
Bilirubin Urine: NEGATIVE
Glucose, UA: NEGATIVE mg/dL
Hgb urine dipstick: NEGATIVE
Ketones, ur: NEGATIVE mg/dL
Leukocytes,Ua: NEGATIVE
Nitrite: NEGATIVE
Protein, ur: NEGATIVE mg/dL
Specific Gravity, Urine: 1.01 (ref 1.005–1.030)
pH: 6 (ref 5.0–8.0)

## 2022-08-15 LAB — TYPE AND SCREEN
ABO/RH(D): A POS
Antibody Screen: NEGATIVE

## 2022-08-15 LAB — RPR: RPR Ser Ql: NONREACTIVE

## 2022-08-15 LAB — PROTEIN / CREATININE RATIO, URINE
Creatinine, Urine: 54 mg/dL
Protein Creatinine Ratio: 0.17 mg/mg{Cre} — ABNORMAL HIGH (ref 0.00–0.15)
Total Protein, Urine: 9 mg/dL

## 2022-08-15 MED ORDER — OXYTOCIN-SODIUM CHLORIDE 30-0.9 UT/500ML-% IV SOLN
1.0000 m[IU]/min | INTRAVENOUS | Status: DC
  Administered 2022-08-15: 2 m[IU]/min via INTRAVENOUS

## 2022-08-15 MED ORDER — EPHEDRINE 5 MG/ML INJ: 10.0000 mg | INTRAVENOUS | Status: DC | PRN

## 2022-08-15 MED ORDER — DIPHENHYDRAMINE HCL 50 MG/ML IJ SOLN: 12.5000 mg | INTRAMUSCULAR | Status: DC | PRN

## 2022-08-15 MED ORDER — PHENYLEPHRINE 80 MCG/ML (10ML) SYRINGE FOR IV PUSH (FOR BLOOD PRESSURE SUPPORT): 80.0000 ug | PREFILLED_SYRINGE | INTRAVENOUS | Status: DC | PRN

## 2022-08-15 MED ORDER — LIDOCAINE HCL (PF) 1 % IJ SOLN
INTRAMUSCULAR | Status: DC | PRN
  Administered 2022-08-15: 11 mL via EPIDURAL

## 2022-08-15 MED ORDER — OXYTOCIN-SODIUM CHLORIDE 30-0.9 UT/500ML-% IV SOLN
2.5000 [IU]/h | INTRAVENOUS | Status: DC
  Filled 2022-08-15: qty 500

## 2022-08-15 MED ORDER — LIDOCAINE HCL (PF) 1 % IJ SOLN: 30.0000 mL | INTRAMUSCULAR | Status: DC | PRN

## 2022-08-15 MED ORDER — TERBUTALINE SULFATE 1 MG/ML IJ SOLN
0.2500 mg | Freq: Once | INTRAMUSCULAR | Status: AC | PRN
  Administered 2022-08-15: 0.25 mg via SUBCUTANEOUS
  Filled 2022-08-15: qty 1

## 2022-08-15 MED ORDER — DIPHENHYDRAMINE HCL 50 MG/ML IJ SOLN
12.5000 mg | Freq: Once | INTRAMUSCULAR | Status: AC
  Administered 2022-08-15: 12.5 mg via INTRAVENOUS
  Filled 2022-08-15: qty 1

## 2022-08-15 MED ORDER — ONDANSETRON HCL 4 MG/2ML IJ SOLN: 4.0000 mg | Freq: Four times a day (QID) | INTRAMUSCULAR | Status: DC | PRN

## 2022-08-15 MED ORDER — LACTATED RINGERS IV SOLN: INTRAVENOUS | Status: DC

## 2022-08-15 MED ORDER — LACTATED RINGERS IV SOLN
500.0000 mL | INTRAVENOUS | Status: DC | PRN
  Administered 2022-08-15 (×2): 500 mL via INTRAVENOUS

## 2022-08-15 MED ORDER — ACETAMINOPHEN 325 MG PO TABS
650.0000 mg | ORAL_TABLET | ORAL | Status: DC | PRN
  Administered 2022-08-15: 650 mg via ORAL
  Filled 2022-08-15: qty 2

## 2022-08-15 MED ORDER — OXYTOCIN BOLUS FROM INFUSION
333.0000 mL | Freq: Once | INTRAVENOUS | Status: AC
  Administered 2022-08-16: 333 mL via INTRAVENOUS

## 2022-08-15 MED ORDER — LACTATED RINGERS IV SOLN
500.0000 mL | Freq: Once | INTRAVENOUS | Status: AC
  Administered 2022-08-15: 500 mL via INTRAVENOUS

## 2022-08-15 MED ORDER — METOCLOPRAMIDE HCL 5 MG/ML IJ SOLN
10.0000 mg | Freq: Once | INTRAMUSCULAR | Status: AC
  Administered 2022-08-15: 10 mg via INTRAVENOUS
  Filled 2022-08-15: qty 2

## 2022-08-15 MED ORDER — FENTANYL-BUPIVACAINE-NACL 0.5-0.125-0.9 MG/250ML-% EP SOLN
12.0000 mL/h | EPIDURAL | Status: DC | PRN
  Administered 2022-08-15 – 2022-08-16 (×2): 12 mL/h via EPIDURAL
  Filled 2022-08-15 (×2): qty 250

## 2022-08-15 MED ORDER — SOD CITRATE-CITRIC ACID 500-334 MG/5ML PO SOLN
30.0000 mL | ORAL | Status: DC | PRN
  Filled 2022-08-15: qty 30

## 2022-08-15 MED ORDER — LACTATED RINGERS IV BOLUS
1000.0000 mL | Freq: Once | INTRAVENOUS | Status: AC
  Administered 2022-08-15: 1000 mL via INTRAVENOUS

## 2022-08-15 NOTE — H&P (Addendum)
OBSTETRIC ADMISSION HISTORY AND PHYSICAL  Karina Torres is a 37 y.o. female [phone] with IUP at 72w0dby LMP presenting for IOL due to spontaneous variables at term while being monitored in the MAU. She reports +FMs, No LOF, no VB, no blurry vision, headaches or peripheral edema, and RUQ pain.  She plans on breast feeding. She request depo for birth control. [place]  [facility] --->  Estimated Date of Delivery: 08/22/22  Sono:    @[redacted]w[redacted]d$ , CWD, normal anatomy, cephalic presentation, anterior lie, 295g, 20% EFW   Prenatal History/Complications:  -Hx of HELLP  -SMA carrier -EIF -Hx of C/S, desires TOLAC -hx of PTD -AMA  Past Medical History: Past Medical History:  Diagnosis Date   Hx of chlamydia infection    Hx of HELLP syndrome, currently pregnant    Hypertension     Past Surgical History: Past Surgical History:  Procedure Laterality Date   CESAREAN SECTION     COLPOSCOPY     WISDOM TOOTH EXTRACTION Bilateral     Obstetrical History: OB History     Gravida  3   Para  2   Term  1   Preterm  1   AB      Living  2      SAB      IAB      Ectopic      Multiple  0   Live Births  2           Social History Social History   Socioeconomic History   Marital status: Single    Spouse name: Not on file   Number of children: Not on file   Years of education: Not on file   Highest education level: Not on file  Occupational History   Not on file  Tobacco Use   Smoking status: Never   Smokeless tobacco: Never  Vaping Use   Vaping Use: Former  Substance and Sexual Activity   Alcohol use: Not Currently    Comment: occasional use- not during pregnancy   Drug use: Not Currently    Types: Marijuana    Comment: every day user "1 or 2 a day"   Sexual activity: Yes    Birth control/protection: None  Other Topics Concern   Not on file  Social History Narrative   Not on file   Social Determinants of Health    Financial Resource Strain: Not on file  Food Insecurity: No Food Insecurity (08/15/2022)   Hunger Vital Sign    Worried About Running Out of Food in the Last Year: Never true    Ran Out of Food in the Last Year: Never true  Transportation Needs: No Transportation Needs (08/15/2022)   PRAPARE - Transportation    Lack of Transportation (Medical): No    Lack of Transportation (Non-Medical): No  Physical Activity: Not on file  Stress: Not on file  Social Connections: Not on file    Family History: Family History  Problem Relation Age of Onset   Asthma Mother    Diabetes Mother    Learning disabilities Son    Diabetes Maternal Grandmother    Diabetes Maternal Grandfather    Diabetes Father    Alcohol abuse Neg Hx    Arthritis Neg Hx    Birth defects Neg Hx    Cancer Neg Hx    COPD Neg Hx    Depression Neg Hx    Drug abuse  Neg Hx    Early death Neg Hx    Hearing loss Neg Hx    Heart disease Neg Hx    Hyperlipidemia Neg Hx    Hypertension Neg Hx    Kidney disease Neg Hx    Mental illness Neg Hx    Mental retardation Neg Hx    Miscarriages / Stillbirths Neg Hx    Stroke Neg Hx    Vision loss Neg Hx    Varicose Veins Neg Hx     Allergies: No Known Allergies  Medications Prior to Admission  Medication Sig Dispense Refill Last Dose   acetaminophen (TYLENOL) 500 MG tablet Take 500 mg by mouth every 6 (six) hours as needed.   08/14/2022   acetaminophen-caffeine (EXCEDRIN TENSION HEADACHE) 500-65 MG TABS per tablet Take 2 tablets by mouth every 6 (six) hours. 60 tablet 0 08/15/2022 at 0300   pantoprazole (PROTONIX) 20 MG tablet Take 20 mg by mouth daily.   Past Month   Blood Pressure Monitoring (BLOOD PRESSURE KIT) DEVI 1 each by Does not apply route once a week. 1 each 0    Prenatal Vit-Fe Fumarate-FA (PRENATAL PO) Take by mouth.   Unknown     Review of Systems   All systems reviewed and negative except as stated in HPI  Blood pressure 100/62, pulse 77, temperature  97.8 F (36.6 C), temperature source Oral, resp. rate 17, weight 81.7 kg, last menstrual period 11/15/2021, SpO2 99 %. General appearance: alert and no distress Lungs: normal effort Heart: regular rate noted Abdomen: gravid Pelvic: see below Extremities: LE edema Presentation: cephalic Fetal monitoringBaseline: 120 bpm, Variability: Good {> 6 bpm), Accelerations: Reactive, and Decelerations: Absent Uterine activityNone Dilation: 3 Effacement (%): 70 Station: -3 Exam by:: Dr. Janus Molder   Prenatal labs: ABO, Rh: --/--/A POS (02/22 0809) Antibody: NEG (02/22 0809) Rubella: 3.28 (08/15 1052) RPR: Non Reactive (12/07 0814)  HBsAg: Negative (08/15 1052)  HIV: Non Reactive (12/07 0814)  GBS: Negative/-- (02/02 1050)  1 hr Glucola 36 Genetic screening  SMA carrier Anatomy US Isolated EIF  Prenatal Transfer Tool  Maternal Diabetes: No Genetic Screening: Abnormal:  Results: Other: SMA carrier, partner declined testing Maternal Ultrasounds/Referrals: Isolated EIF (echogenic intracardiac focus) Fetal Ultrasounds or other Referrals:  None Maternal Substance Abuse:  No Significant Maternal Medications:  None Significant Maternal Lab Results:  Group B Strep negative Number of Prenatal Visits:greater than 3 verified prenatal visits Other Comments:  None  Results for orders placed or performed during the hospital encounter of 08/15/22 (from the past 24 hour(s))  Urinalysis, Routine w reflex microscopic -Urine, Clean Catch   Collection Time: 08/15/22  7:44 AM  Result Value Ref Range   Color, Urine YELLOW YELLOW   APPearance CLEAR CLEAR   Specific Gravity, Urine 1.010 1.005 - 1.030   pH 6.0 5.0 - 8.0   Glucose, UA NEGATIVE NEGATIVE mg/dL   Hgb urine dipstick NEGATIVE NEGATIVE   Bilirubin Urine NEGATIVE NEGATIVE   Ketones, ur NEGATIVE NEGATIVE mg/dL   Protein, ur NEGATIVE NEGATIVE mg/dL   Nitrite NEGATIVE NEGATIVE   Leukocytes,Ua NEGATIVE NEGATIVE  Protein / creatinine ratio,  urine   Collection Time: 08/15/22  7:54 AM  Result Value Ref Range   Creatinine, Urine 54 mg/dL   Total Protein, Urine 9 mg/dL   Protein Creatinine Ratio 0.17 (H) 0.00 - 0.15 mg/mg[Cre]  CBC   Collection Time: 08/15/22  8:09 AM  Result Value Ref Range   WBC 10.7 (H) 4.0 - 10.5 K/uL  RBC 4.04 3.87 - 5.11 MIL/uL   Hemoglobin 12.1 12.0 - 15.0 g/dL   HCT 36.3 36.0 - 46.0 %   MCV 89.9 80.0 - 100.0 fL   MCH 30.0 26.0 - 34.0 pg   MCHC 33.3 30.0 - 36.0 g/dL   RDW 13.7 11.5 - 15.5 %   Platelets 265 150 - 400 K/uL   nRBC 0.0 0.0 - 0.2 %  Comprehensive metabolic panel   Collection Time: 08/15/22  8:09 AM  Result Value Ref Range   Sodium 135 135 - 145 mmol/L   Potassium 4.1 3.5 - 5.1 mmol/L   Chloride 104 98 - 111 mmol/L   CO2 21 (L) 22 - 32 mmol/L   Glucose, Bld 92 70 - 99 mg/dL   BUN 6 6 - 20 mg/dL   Creatinine, Ser 0.72 0.44 - 1.00 mg/dL   Calcium 8.7 (L) 8.9 - 10.3 mg/dL   Total Protein 6.2 (L) 6.5 - 8.1 g/dL   Albumin 2.6 (L) 3.5 - 5.0 g/dL   AST 20 15 - 41 U/L   ALT 14 0 - 44 U/L   Alkaline Phosphatase 184 (H) 38 - 126 U/L   Total Bilirubin 0.4 0.3 - 1.2 mg/dL   GFR, Estimated >60 >60 mL/min   Anion gap 10 5 - 15  Type and screen [place]   Collection Time: 08/15/22  8:09 AM  Result Value Ref Range   ABO/RH(D) A POS    Antibody Screen NEG    Sample Expiration      08/18/2022,2359 Performed at [facility], [address] [place], [place] [address]     Patient Active Problem List   Diagnosis Date Noted   Indication for care in labor and delivery, antepartum 08/15/2022   Preterm uterine contractions 06/27/2022   Fetal echogenic intracardiac focus on prenatal ultrasound 04/08/2022   SMA carrier 02/15/2022   AMA (advanced maternal age) multigravida 35+ 02/05/2022   History of preterm delivery, currently pregnant in first trimester 02/05/2022   Supervision of high risk pregnancy, antepartum 01/29/2022   History of HELLP syndrome, currently  pregnant 12/18/2021   History of C-section 12/18/2021    Assessment/Plan:  Karina Torres is a 37 y.o. G3P1102 at 33w0dhere for IOL 2/2 desired TOLAC (Hx of successful VBAC, tested to 3000g) following MAU visit with prolong decels that improved with maternal positioning.   #Labor: Progressing form OV 2/20 from 1.5 to 3 cm. Start pitocin 2/2. Consider early AROM. #Pain: Maternally supported #FWB: Cat I  #ID: GBS neg #MOF: Breast #MOC: Depo #Circ: Yes  Simone Autry-Lott, DO  08/15/2022, 12:41 PM

## 2022-08-15 NOTE — Progress Notes (Signed)
Labor Progress Note LAURIANNE QUERTERMOUS is a 37 y.o. [id number] at 68w0dpresented for IOL 2/2 spontaneous variables at term while being monitored in the MAU.   S: Called by RN to room due to prolonged decel.   O:  BP 120/65   Pulse (!) 106   Temp 98.6 F (37 C) (Oral)   Resp 17   Wt 81.7 kg   LMP 11/15/2021 (Exact Date)   SpO2 99%   BMI 34.05 kg/m  EFM: 120bpm/moderate/+accels, 3 min prolonged decel  CVE: Dilation: 3 Effacement (%): 70 Station: -3 Presentation: Vertex Exam by:: Dr. AJanus Molder  A&P: 37y.o. [id number] for IOL for the above.  #Labor: Pitocin recently started and turned off at 4. Terb x1 given. Plan to allow fetus to recover and consider restarting pitocin. In the event of continue prolong decels with argumentation will proceed with c-section.   #Pain: maternally supported #FWB: Cat II, improved to Cat I following the above interventions #GBS negative   Jenayah Antu Autry-Lott, DO 4:17 PM

## 2022-08-15 NOTE — Anesthesia Procedure Notes (Signed)
Epidural Patient location during procedure: OB Start time: 08/15/2022 10:08 PM End time: 08/15/2022 10:32 PM  Staffing Anesthesiologist: Lynda Rainwater, MD Performed: anesthesiologist   Preanesthetic Checklist Completed: patient identified, IV checked, site marked, risks and benefits discussed, surgical consent, monitors and equipment checked, pre-op evaluation and timeout performed  Epidural Patient position: sitting Prep: ChloraPrep Patient monitoring: heart rate, cardiac monitor, continuous pulse ox and blood pressure Approach: midline Location: L2-L3 Injection technique: LOR saline  Needle:  Needle type: Tuohy  Needle gauge: 17 G Needle length: 9 cm Needle insertion depth: 7 cm Catheter type: closed end flexible Catheter size: 20 Guage Catheter at skin depth: 11 cm Test dose: negative  Assessment Events: blood not aspirated, injection not painful, no injection resistance, no paresthesia and negative IV test  Additional Notes Epidural placed by SRNA under direct supervisionReason for block:procedure for pain

## 2022-08-15 NOTE — Progress Notes (Signed)
Labor Progress Note Karina Torres is a 37 y.o. [id number] at 67w0dpresented for IOL 2/2 spontaneous variables at term while being monitored in the MAU.   S: Patient just received epidural, is feeling more comfortable.  O:  BP 111/67   Pulse 72   Temp 98.8 F (37.1 C) (Oral)   Resp 18   Ht 5' 1"$  (1.549 m)   Wt 81.7 kg   LMP 11/15/2021 (Exact Date)   SpO2 99%   BMI 34.05 kg/m  EFM: 135/moderate/accels present, no decels  CVE: Dilation: 4 Effacement (%): 70 Station: -3 Presentation: Vertex Exam by:: MClement HusbandsMD   A&P: 37y.o. GDC:[id number] 2/2 spontaneous variables at term while being monitored in the MAU.   #Labor: S/p FB. SROM 2018. Continue pitocin.   #Pain: epidural #FWB: Cat I (review of strip shows decel shortly after epidural placement with rapid recovery) #GBS negative  LyDarlen RoundMD PGY-1 11:30 PM

## 2022-08-15 NOTE — Anesthesia Preprocedure Evaluation (Signed)
Anesthesia Evaluation  Patient identified by MRN, date of birth, ID band Patient awake    Reviewed: Allergy & Precautions, H&P , NPO status , Patient's Chart, lab work & pertinent test results  Airway Mallampati: II  TM Distance: >3 FB Neck ROM: full    Dental no notable dental hx.    Pulmonary neg pulmonary ROS   Pulmonary exam normal        Cardiovascular hypertension, Pt. on medications negative cardio ROS Normal cardiovascular exam Rhythm:regular Rate:Normal     Neuro/Psych negative neurological ROS  negative psych ROS   GI/Hepatic negative GI ROS, Neg liver ROS,,,  Endo/Other  negative endocrine ROS    Renal/GU negative Renal ROS     Musculoskeletal   Abdominal Normal abdominal exam  (+)   Peds  Hematology negative hematology ROS (+)   Anesthesia Other Findings   Reproductive/Obstetrics (+) Pregnancy                             Anesthesia Physical Anesthesia Plan  ASA: II  Anesthesia Plan: Epidural   Post-op Pain Management:    Induction:   PONV Risk Score and Plan:   Airway Management Planned:   Additional Equipment:   Intra-op Plan:   Post-operative Plan:   Informed Consent: I have reviewed the patients History and Physical, chart, labs and discussed the procedure including the risks, benefits and alternatives for the proposed anesthesia with the patient or authorized representative who has indicated his/her understanding and acceptance.       Plan Discussed with:   Anesthesia Plan Comments:         Anesthesia Quick Evaluation

## 2022-08-15 NOTE — MAU Note (Signed)
.  Karina Torres is a 37 y.o. at 82w0dhere in MAU reporting: BP 140s/90s this morning with a persistent headache. Has also been having visual disturbances. Denies epigastric pain. Took 10025mof tylenol last night and 2 Excedrin this morning with minimal relief. Has had some spotting but denies LOF. +FM.  Pain score: 7 Vitals:   08/15/22 0737  BP: 132/87  Pulse: 87  Resp: 14  Temp: (!) 97.5 F (36.4 C)  SpO2: 99%     FHT:137 Lab orders placed from triage:  UA

## 2022-08-15 NOTE — Progress Notes (Signed)
Labor Progress Note [name] is a 37 y.o. [id number] at 24w0dpresented for IOL 2/2 spontaneous variables at term while being monitored in the MAU.  S: Foley bulb fell out and gush of fluids while patient in bathroom.   O:  BP 125/77 (BP Location: Left Arm)   Pulse 89   Temp 98.6 F (37 C) (Oral)   Resp 18   Ht 5' 1"$  (1.549 m)   Wt 81.7 kg   LMP 11/15/2021 (Exact Date)   SpO2 99%   BMI 34.05 kg/m  EFM: 135/moderate/accelerations present/decels absent  CVE: Dilation: 4 Effacement (%): 70 Station: -3 Presentation: Vertex Exam by:: MClement HusbandsMD   A&P: 37y.o. GDC:[id number] for IOL for the above.  #Labor: Progressing well. S/p FB. SROM 2018. Contracting q2m14montinue pitocin.  #Pain: epidural upon request #FWB: Cat I #GBS negative  LydDarlen RoundD PGY-1 8:52 PM

## 2022-08-15 NOTE — Progress Notes (Signed)
Labor Progress Note Karina Torres is a 37 y.o. [id number] at 56w0dpresented for IOL 2/2 spontaneous variables at term while being monitored in the MAU.   S: No acute concerns.   O:  BP 120/65   Pulse (!) 106   Temp 98.6 F (37 C) (Oral)   Resp 17   Wt 81.7 kg   LMP 11/15/2021 (Exact Date)   SpO2 99%   BMI 34.05 kg/m  EFM: 125bpm/moderate/+accels, no decels  CVE: Dilation: 3 Effacement (%): 70 Station: -3 Presentation: Vertex Exam by:: Dr. AJanus Molder  A&P: 37y.o. GDC:[id number] for IOL for the above.  #Labor: Pitocin restarted #Pain: maternally supported #FWB: Cat I  #GBS negative   Karina Able Autry-Lott, DO 4:34 PM

## 2022-08-15 NOTE — Progress Notes (Signed)
Labor Progress Note Karina Torres is a 37 y.o. [id number] at 71w0dpresented for IOL 2/2 spontaneous variables at term while being monitored in the MAU.   S: No acute concerns.   O:  BP 126/81   Pulse 88   Temp 98.6 F (37 C) (Oral)   Resp 19   Wt 81.7 kg   LMP 11/15/2021 (Exact Date)   SpO2 99%   BMI 34.05 kg/m  EFM: 145bpm/moderate/+accels, variables during cervical exam  CVE: Dilation: 1.5 Effacement (%): 80 Station: -3 Presentation: Vertex Exam by:: AJanus MolderMD   A&P: 37y.o. GDC:[id number] for IOL for the above.  #Labor: Pitocin turned down to 6 and FB placed. Cap at 6 until FB out and then AROM when able.  #Pain: maternally supported #FWB: Cat I, improved from Cat II with decel during vaginal exam #GBS negative   [name], DO 8:15 PM

## 2022-08-15 NOTE — MAU Provider Note (Signed)
History     CSN: [id number]  Arrival date and time: 08/15/22 [id number]   Event Date/Time   First Provider Initiated Contact with Patient 08/15/22 [phone]      Chief Complaint  Patient presents with   Headache   Hypertension    Karina Torres is a 37 y.o. G3P1102 at 75w0dwho [facility]  She presents today for headache and elevated blood pressure.  She reports her HA started yesterday afternoon and gradually worsened. She states around 10pm she took Tylenol for a HA of 5/10, but received no relief.  She states she was unable to sleep and took Excedrin Tension around 0300.  She states her HA was about the same and this helped, but the HA got worse. She states the HA is on her right temporal area and describes it as a "constant pain." She endorses a history of migraines.  She states she took her blood pressure this morning, around 0646, and it was 141/98. She reports she has taken her blood pressure at home, regularly, throughout the pregnancy with no elevations. She endorses fetal movement and denies contractions.  She reports some vaginal bleeding that started around 2000.  She reports it is "very minimal" and brown in color. She states she was checked Tuesday and has had this spotting since.   OB History     Gravida  3   Para  2   Term  1   Preterm  1   AB      Living  2      SAB      IAB      Ectopic      Multiple  0   Live Births  2           Past Medical History:  Diagnosis Date   Hx of chlamydia infection    Hx of HELLP syndrome, currently pregnant    Hypertension     Past Surgical History:  Procedure Laterality Date   CESAREAN SECTION     COLPOSCOPY      Family History  Problem Relation Age of Onset   Asthma Mother    Diabetes Mother    Learning disabilities Son    Diabetes Maternal Grandmother    Diabetes Maternal Grandfather    Diabetes Father    Alcohol abuse Neg Hx    Arthritis Neg Hx    Birth defects Neg Hx    Cancer Neg Hx     COPD Neg Hx    Depression Neg Hx    Drug abuse Neg Hx    Early death Neg Hx    Hearing loss Neg Hx    Heart disease Neg Hx    Hyperlipidemia Neg Hx    Hypertension Neg Hx    Kidney disease Neg Hx    Mental illness Neg Hx    Mental retardation Neg Hx    Miscarriages / Stillbirths Neg Hx    Stroke Neg Hx    Vision loss Neg Hx    Varicose Veins Neg Hx     Social History   Tobacco Use   Smoking status: Never   Smokeless tobacco: Never  Vaping Use   Vaping Use: Former  Substance Use Topics   Alcohol use: Not Currently    Comment: occasional use- not during pregnancy   Drug use: Not Currently    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   08/14/2022   acetaminophen-caffeine (EXCEDRIN TENSION HEADACHE) 500-65 MG TABS per tablet Take 2 tablets by mouth every 6 (six) hours. 60 tablet 0 08/15/2022 at 0300   Blood Pressure Monitoring (BLOOD PRESSURE KIT) DEVI 1 each by Does not apply route once a week. 1 each 0    pantoprazole (PROTONIX) 20 MG tablet Take 20 mg by mouth daily.      Prenatal Vit-Fe Fumarate-FA (PRENATAL PO) Take by mouth. (Patient not taking: Reported on 07/26/2022)       Review of Systems  Eyes:  Positive for photophobia (With driving last night). Negative for visual disturbance.  Gastrointestinal:  Negative for abdominal pain, constipation, diarrhea, nausea and vomiting.  Genitourinary:  Positive for vaginal bleeding [employer]). Negative for difficulty urinating, dysuria and vaginal discharge.  Neurological:  Positive for dizziness and headaches. Negative for light-headedness.   Physical Exam   Blood pressure 132/87, pulse 87, temperature (!) 97.5 F (36.4 C), temperature source Oral, resp. rate 14, last menstrual period 11/15/2021, SpO2 99  %.  Physical Exam Vitals reviewed.  Constitutional:      Appearance: She is well-developed.  HENT:     Head: Normocephalic and atraumatic.  Eyes:     Conjunctiva/sclera: Conjunctivae normal.  Cardiovascular:     Rate and Rhythm: Normal rate.  Pulmonary:     Effort: Pulmonary effort is normal.  Abdominal:     General: Bowel sounds are normal.     Tenderness: There is no abdominal tenderness.     Comments: Gravid, Appears AGA  Musculoskeletal:        General: Normal range of motion.     Cervical back: Normal range of motion.  Skin:    General: Skin is warm and dry.  Neurological:     Mental Status: She is alert and oriented to person, place, and time.  Psychiatric:        Mood and Affect: Mood normal.        Behavior: Behavior normal.     Fetal Assessment 135 bpm, Mod Var, -Decels, +15x15 Accels Toco: Irregular  MAU Course   Results for orders placed or performed during the hospital encounter of 08/15/22 (from the past 24 hour(s))  Urinalysis, Routine w reflex microscopic -Urine, Clean Catch     Status: None   Collection Time: 08/15/22  7:44 AM  Result Value Ref Range   Color, Urine YELLOW YELLOW   APPearance CLEAR CLEAR   Specific Gravity, Urine 1.010 1.005 - 1.030   pH 6.0 5.0 - 8.0   Glucose, UA NEGATIVE NEGATIVE mg/dL   Hgb urine dipstick NEGATIVE NEGATIVE   Bilirubin Urine NEGATIVE NEGATIVE   Ketones, ur NEGATIVE NEGATIVE mg/dL   Protein, ur NEGATIVE NEGATIVE mg/dL   Nitrite NEGATIVE NEGATIVE   Leukocytes,Ua NEGATIVE NEGATIVE   No results found.  MDM PE Labs: CBC, CMP, PC Ratio EFM Start IV LR Bolus Medications Assessment and Plan  37 year old G3P1102  SIUP at 80 weeks Cat I FT Headache  -POC Reviewed -Patient offered and accepts pain medication.  -Exam performed. -Start IV with LR Bolus. -Reglan and Benadryl for HA treatment. -Labs ordered.  -NST Reactive. -Monitor and reassess.   Maryann Conners MSN, CNM 08/15/2022, 7:41 AM    Reassessment (8:24 AM) -Fetal decel noted. Provider to bedside. -Resolution with position change. -  Dr. Jeani Sow. Anyanwu contacted and advises admission. -Patient updated on POC. Agrees with POC.  -BSUS performed and confirms cephalic presentation.   Maryann Conners MSN, CNM Advanced Practice Provider, [facility]

## 2022-08-16 ENCOUNTER — Encounter (HOSPITAL_COMMUNITY): Payer: Self-pay | Admitting: Family Medicine

## 2022-08-16 DIAGNOSIS — Z3A39 39 weeks gestation of pregnancy: Secondary | ICD-10-CM

## 2022-08-16 DIAGNOSIS — O34211 Maternal care for low transverse scar from previous cesarean delivery: Secondary | ICD-10-CM

## 2022-08-16 DIAGNOSIS — O09523 Supervision of elderly multigravida, third trimester: Secondary | ICD-10-CM

## 2022-08-16 DIAGNOSIS — O34219 Maternal care for unspecified type scar from previous cesarean delivery: Secondary | ICD-10-CM | POA: Insufficient documentation

## 2022-08-16 MED ORDER — SIMETHICONE 80 MG PO CHEW: 80.0000 mg | CHEWABLE_TABLET | ORAL | Status: DC | PRN

## 2022-08-16 MED ORDER — DIBUCAINE (PERIANAL) 1 % EX OINT: 1.0000 | TOPICAL_OINTMENT | CUTANEOUS | Status: DC | PRN

## 2022-08-16 MED ORDER — ZOLPIDEM TARTRATE 5 MG PO TABS: 5.0000 mg | ORAL_TABLET | Freq: Every evening | ORAL | Status: DC | PRN

## 2022-08-16 MED ORDER — COCONUT OIL OIL: 1.0000 | TOPICAL_OIL | Status: DC | PRN

## 2022-08-16 MED ORDER — WITCH HAZEL-GLYCERIN EX PADS: 1.0000 | MEDICATED_PAD | CUTANEOUS | Status: DC | PRN

## 2022-08-16 MED ORDER — DIPHENHYDRAMINE HCL 25 MG PO CAPS: 25.0000 mg | ORAL_CAPSULE | Freq: Four times a day (QID) | ORAL | Status: DC | PRN

## 2022-08-16 MED ORDER — OXYTOCIN-SODIUM CHLORIDE 30-0.9 UT/500ML-% IV SOLN: 1.0000 m[IU]/min | INTRAVENOUS | Status: DC

## 2022-08-16 MED ORDER — SODIUM CHLORIDE 0.9% FLUSH: 3.0000 mL | Freq: Two times a day (BID) | INTRAVENOUS | Status: DC

## 2022-08-16 MED ORDER — ACETAMINOPHEN 325 MG PO TABS: 650.0000 mg | ORAL_TABLET | ORAL | Status: DC | PRN

## 2022-08-16 MED ORDER — IBUPROFEN 600 MG PO TABS
600.0000 mg | ORAL_TABLET | Freq: Four times a day (QID) | ORAL | Status: DC
  Administered 2022-08-16 – 2022-08-17 (×5): 600 mg via ORAL
  Filled 2022-08-16 (×5): qty 1

## 2022-08-16 MED ORDER — BENZOCAINE-MENTHOL 20-0.5 % EX AERO
1.0000 | INHALATION_SPRAY | CUTANEOUS | Status: DC | PRN
  Administered 2022-08-16: 1 via TOPICAL
  Filled 2022-08-16: qty 56

## 2022-08-16 MED ORDER — ONDANSETRON HCL 4 MG/2ML IJ SOLN: 4.0000 mg | INTRAMUSCULAR | Status: DC | PRN

## 2022-08-16 MED ORDER — PRENATAL MULTIVITAMIN CH
1.0000 | ORAL_TABLET | Freq: Every day | ORAL | Status: DC
  Administered 2022-08-16 – 2022-08-17 (×2): 1 via ORAL
  Filled 2022-08-16 (×2): qty 1

## 2022-08-16 MED ORDER — SENNOSIDES-DOCUSATE SODIUM 8.6-50 MG PO TABS
2.0000 | ORAL_TABLET | ORAL | Status: DC
  Administered 2022-08-17: 2 via ORAL
  Filled 2022-08-16: qty 2

## 2022-08-16 MED ORDER — SODIUM CHLORIDE 0.9 % IV SOLN: 250.0000 mL | INTRAVENOUS | Status: DC | PRN

## 2022-08-16 MED ORDER — SODIUM CHLORIDE 0.9% FLUSH: 3.0000 mL | INTRAVENOUS | Status: DC | PRN

## 2022-08-16 MED ORDER — ONDANSETRON HCL 4 MG PO TABS: 4.0000 mg | ORAL_TABLET | ORAL | Status: DC | PRN

## 2022-08-16 NOTE — Progress Notes (Signed)
Mom per [employer] did not want cotton balls in diaper for infant.

## 2022-08-16 NOTE — Lactation Note (Signed)
This note was copied from a baby's chart. Lactation Consultation Note  Patient Name: Karina Torres [id number] Date: 08/16/2022 Reason for consult: Follow-up assessment;Mother's request;Term;Breastfeeding assistance Age:37 hours  LC called to patient room by RN Alvis Lemmings.  The birth parent stated that she had spoken with her insurance company and they told her that she could get a breast pump at the hospital.  [facility] sent the stork pump form and let the birth parent know that her pump will be delivered as long as she qualifies.  The birth parent is aware that she has to qualify to get a pump and she knows that she will receive Spectra or Medela.  The birth parent had no further questions or concerns.  The birth parent will call RN/LC for assistance with breastfeeding.   Consult Status Consult Status: Follow-up Date: 08/17/22 Follow-up type: In-patient    [name] 08/16/2022, 1:33 PM

## 2022-08-16 NOTE — Anesthesia Postprocedure Evaluation (Signed)
Anesthesia Post Note  Patient: Karina Torres  Procedure(s) Performed: AN AD HOC LABOR EPIDURAL     Patient location during evaluation: Mother Baby Anesthesia Type: Epidural Level of consciousness: awake and alert Pain management: pain level controlled Vital Signs Assessment: post-procedure vital signs reviewed and stable Respiratory status: spontaneous breathing, nonlabored ventilation and respiratory function stable Cardiovascular status: stable Postop Assessment: no headache, no backache, epidural receding, no apparent nausea or vomiting, patient able to bend at knees, able to ambulate and adequate PO intake Anesthetic complications: no   No notable events documented.  Last Vitals:  Vitals:   08/16/22 0914 08/16/22 1313  BP: 126/79 119/74  Pulse: 86 94  Resp: 18 17  Temp: 36.7 C 36.8 C  SpO2:  99%    Last Pain:  Vitals:   08/16/22 1313  TempSrc: Oral  PainSc:    Pain Goal:                   [employer]

## 2022-08-16 NOTE — Progress Notes (Signed)
Labor Progress Note AMADITA STONEBREAKER is a 38 y.o. [id number] at 46w0dpresented for IOL 2/2 spontaneous variables at term while being monitored in the MAU.    S: FHT with late decels and some variables. Pt uncomfortable  O:  BP 124/78   Pulse 89   Temp 98.6 F (37 C) (Oral)   Resp 20   Ht '5\' 1"'$  (1.549 m)   Wt 81.7 kg   LMP 11/15/2021 (Exact Date)   SpO2 99%   BMI 34.05 kg/m  EFM: 125bpm/Moderate variability/ 15x15 accels/ Variable and alte decels  CVE: Dilation: 6 Effacement (%): 80 Station: -2 Presentation: Vertex Exam by:: CClotilde DieterRN   A&P: 37y.o. [id number] 2/2 spontaneous variables at term while being monitored in the MAU.    #Labor: SVE progressing. Repositioned mom. Can consider Amnioinfusion if decels persist #Pain: epidural #FWB: Cat 2- see above #GBS negative  JeShelda PalDO FMBorrego Springsellow, Faculty practice [place]walk[place] WoL[place]2/23/24  5:38 AM

## 2022-08-16 NOTE — Progress Notes (Addendum)
Labor Progress Note Karina Torres is a 37 y.o. [id number] at 51w0dpresented for IOL 2/2 spontaneous variables at term while being monitored in the MAU.    S: FHT with late deceleration, pit turned off per nursing. Pt frustrated at the lack of her progress. Asking about C-section.  O:  BP 124/80   Pulse 79   Temp 98.8 F (37.1 C) (Oral)   Resp 18   Ht '5\' 1"'$  (1.549 m)   Wt 81.7 kg   LMP 11/15/2021 (Exact Date)   SpO2 100%   BMI 34.05 kg/m  EFM: 135/moderate/accels present, no decels  CVE: Dilation: 4 Effacement (%): 70 Station: -3 Presentation: Vertex Exam by:: MClement HusbandsMD   A&P: 37y.o. [id number] 2/2 spontaneous variables at term while being monitored in the MAU.    #Labor: SVE unchanged.  Has been on/off pitocin for 12+ hours and remained in latent phase of labor. IUPC placed. Bulging forebag, AROM clear. Pit off now and IVB ongoing. FHT reassuring. Plan to restart Pitocin 1x1 once FHT CAT 1 for 30+ min.  Discussed risks and benefits of attempting all options for vaginal delivery before choosing a surgical delivery. The risks of surgery were discussed with the patient including but were not limited to: bleeding which may require transfusion or reoperation; infection which may require antibiotics; injury to bowel, bladder, ureters or other surrounding organs; injury to the fetus; need for additional procedures including hysterectomy in the event of a life-threatening hemorrhage; formation of adhesions; placental abnormalities wth subsequent pregnancies; incisional problems; thromboembolic phenomenon and other postoperative/anesthesia complications. The patient expressed understanding and agrees to attempt for vaginal delivery as long as FHT allows.   #Pain: epidural #FWB: Cat I #GBS negative  JeShelda PalDO PGY-1 1:11 AM

## 2022-08-16 NOTE — Lactation Note (Signed)
This note was copied from a baby's chart. Lactation Consultation Note  Patient Name: Karina Torres [id number] Date: 08/16/2022 Reason for consult: Initial assessment Age:37 hours  P3, Mother hand expressed drops and then [facility] assisted with latching in both football and cradle hold.  Provided pillows for support.  Encouraged mother to keep baby deep on breast.  Feed on demand with cues.  Goal 8-12+ times per day after first 24 hrs.  Place baby STS if not cueing.    Maternal Data Has patient been taught Hand Expression?: Yes Does the patient have breastfeeding experience prior to this delivery?: Yes How long did the patient breastfeed?: 3 mos. with last child, first child in NICU and pumped  Feeding Mother's Current Feeding Choice: Breast Milk  LATCH Score Latch: Grasps breast easily, tongue down, lips flanged, rhythmical sucking.  Audible Swallowing: A few with stimulation  Type of Nipple: Everted at rest and after stimulation  Comfort (Breast/Nipple): Soft / non-tender  Hold (Positioning): Assistance needed to correctly position infant at breast and maintain latch.  LATCH Score: 8  Interventions Interventions: Breast feeding basics reviewed;Assisted with latch;Support pillows;Hand express;Education;LC Services brochure Consult Status Consult Status: Follow-up Date: 08/17/22 Follow-up type: In-patient    Vivianne Master [facility] 08/16/2022, 9:02 AM

## 2022-08-16 NOTE — Discharge Summary (Signed)
Postpartum Discharge Summary  Date of Service updated***     Patient Name: Karina Torres DOB: 12-19-1985 MRN: [id number]  Date of admission: 08/15/2022 Delivery date:08/16/2022  Delivering provider: Shelda Pal  Date of discharge: 08/16/2022  Admitting diagnosis: Indication for care in labor and delivery, antepartum [O75.9] Intrauterine pregnancy: [redacted]w[redacted]d    Secondary diagnosis:  Principal Problem:   Vaginal birth after cesarean delivery Active Problems:   Status post vacuum-assisted vaginal delivery   Supervision of high risk pregnancy, antepartum   AMA (advanced maternal age) multigravida 35+   Indication for care in labor and delivery, antepartum  Additional problems: ***    Discharge diagnosis: VBAC                                              Post partum procedures:{Postpartum procedures:23558} Augmentation: AROM, Pitocin, and IP Foley Complications: None  Hospital course: Induction of Labor With Vaginal Delivery   37y.o. yo [id number] 331w1das admitted to the hospital 08/15/2022 for induction of labor.  Indication for induction: AMA.  Patient had an labor course complicated by none. Membrane Rupture Time/Date: 8:18 PM ,08/15/2022   Delivery Method:Vaginal, Vacuum (Extractor)  Episiotomy: None  Lacerations:  1st degree  Details of delivery can be found in separate delivery note.  Patient had a postpartum course complicated by***. Patient is discharged home 08/16/22.  Newborn Data: Birth date:08/16/2022  Birth time:5:58 AM  Gender:Female  Living status:Living  Apgars:8 ,9  Weight:   Magnesium Sulfate received: {Mag received:30440022} BMZ received: No Rhophylac:N/A MMR:N/A T-DaP:Given prenatally Flu: No Transfusion:{Transfusion received:30440034}  Physical exam  Vitals:   08/16/22 0331 08/16/22 0401 08/16/22 0458 08/16/22 0611  BP: 112/71 124/78    Pulse: 86 89    Resp:   20   Temp:    98.7 F (37.1 C)  TempSrc:    Oral  SpO2:   99%    Weight:      Height:       General: {Exam; general:21111117} Lochia: {Desc; appropriate/inappropriate:30686::"appropriate"} Uterine Fundus: {Desc; firm/soft:30687} Incision: {Exam; incision:21111123} DVT Evaluation: {Exam; dvt:2111122} Labs: Lab Results  Component Value Date   WBC 10.7 (H) 08/15/2022   HGB 12.1 08/15/2022   HCT 36.3 08/15/2022   MCV 89.9 08/15/2022   PLT 265 08/15/2022      Latest Ref Rng & Units 08/15/2022    8:09 AM  CMP  Glucose 70 - 99 mg/dL 92   BUN 6 - 20 mg/dL 6   Creatinine 0.44 - 1.00 mg/dL 0.72   Sodium 135 - 145 mmol/L 135   Potassium 3.5 - 5.1 mmol/L 4.1   Chloride 98 - 111 mmol/L 104   CO2 22 - 32 mmol/L 21   Calcium 8.9 - 10.3 mg/dL 8.7   Total Protein 6.5 - 8.1 g/dL 6.2   Total Bilirubin 0.3 - 1.2 mg/dL 0.4   Alkaline Phos 38 - 126 U/L 184   AST 15 - 41 U/L 20   ALT 0 - 44 U/L 14    Edinburgh Score:     No data to display           After visit meds:  Allergies as of 08/16/2022   No Known Allergies   Med Rec must be completed [facility]        Discharge home in stable condition Infant Feeding: {Baby  feeding:23562} Infant Disposition:{CHL IP OB HOME WITH DX:[id number] Discharge instruction: per After Visit Summary and Postpartum booklet. Activity: Advance as tolerated. Pelvic rest for 6 weeks.  Diet: {OB BY:630183 Future Appointments: Future Appointments  Date Time Provider Edna  08/19/2022  1:15 PM Woodroe Mode, MD [facility]  08/27/2022  8:15 AM Tresea Mall, CNM [facility]  08/27/2022 10:30 AM Thornton Park, MD [facility]   Follow up Visit: Message sent to [facility] 2/23  Please schedule this patient for a In person postpartum visit in 6 weeks with the following provider: Any provider. Additional Postpartum F/U: n/a   High risk pregnancy complicated by:  AMA Delivery mode:  Vaginal, Vacuum (Extractor)  Anticipated Birth Control:  Depo   08/16/2022 Shelda Pal, DO

## 2022-08-17 LAB — CBC
HCT: 32.6 % — ABNORMAL LOW (ref 36.0–46.0)
Hemoglobin: 10.4 g/dL — ABNORMAL LOW (ref 12.0–15.0)
MCH: 29.6 pg (ref 26.0–34.0)
MCHC: 31.9 g/dL (ref 30.0–36.0)
MCV: 92.9 fL (ref 80.0–100.0)
Platelets: 227 10*3/uL (ref 150–400)
RBC: 3.51 MIL/uL — ABNORMAL LOW (ref 3.87–5.11)
RDW: 14.1 % (ref 11.5–15.5)
WBC: 13.9 10*3/uL — ABNORMAL HIGH (ref 4.0–10.5)
nRBC: 0 % (ref 0.0–0.2)

## 2022-08-17 MED ORDER — MEDROXYPROGESTERONE ACETATE 150 MG/ML IM SUSP
150.0000 mg | Freq: Once | INTRAMUSCULAR | Status: AC
  Administered 2022-08-17: 150 mg via INTRAMUSCULAR
  Filled 2022-08-17: qty 1

## 2022-08-17 MED ORDER — IBUPROFEN 600 MG PO TABS: 600.0000 mg | ORAL_TABLET | Freq: Four times a day (QID) | ORAL | 0 refills | Status: DC | PRN

## 2022-08-17 NOTE — Lactation Note (Addendum)
This note was copied from a baby's chart. Lactation Consultation Note  Patient Name: Karina Torres [id number] Date: 08/17/2022 Reason for consult: Follow-up assessment;Term;Breastfeeding assistance;Infant weight loss (2.91% WL) Age:37 hours  LC entered the room and the birth parent was holding the infant.  Per the birth parent, the infant is feeding well.  She stated that he has been sleepy.  The birth parent stated that the infant fed for 15 min at the last feeding, but he was falling asleep.  The birth parent was excited about receiving her stork pump yesterday.  The birth parent said that she will start pumping when she gets home.  LC encouraged the birth parent to pump every 3 hours if the infant is not latching and is sleepy.  If the infant is awake during feedings the birth parent will continue to put the infant to the breast.  LC reviewed engorgement, breast care, warning signs, mastitis, milk storage guidelines, and infant I/O.  The birth parent had no further questions.   Infant Feeding Plan:  Breastfeed 8+ times in 24 hours according to feeding cues.  Pump if the infant is sleepy or uninterested in feeding and feed the expressed milk to the infant via a bottle.  Put the infant to the breast prior to supplementing.  Watch infant output and call the pediatrician with questions or concerns.  Call the outpatient [facility] for assistance with breastfeeding.   Maternal Data    Feeding    LATCH Score                    Lactation Tools Discussed/Used    Interventions Interventions: Education  Discharge Discharge Education: Engorgement and breast care;Warning signs for feeding baby;Outpatient recommendation Pump: Stork Pump  Consult Status Consult Status: Complete Date: 08/17/22 Follow-up type: Call as needed    [name] 08/17/2022, 2:54 PM

## 2022-08-17 NOTE — Clinical Social Work Maternal (Signed)
CLINICAL SOCIAL WORK MATERNAL/CHILD NOTE  Patient Details  Name: Karina Torres MRN: [id number] Date of Birth: 05-22-1986  Date:  08/17/2022  Clinical Social Worker Initiating Note:  Idamae Lusher, [place] Date/Time: Initiated:  08/17/22/1515     Child's Name:  Karina Torres   Biological Parents:  Mother, Father (FOB: Tana Conch, FOB: 11/21/1985)   Need for Interpreter:  None   Reason for Referral:  Current Substance Use/Substance Use During Pregnancy     Address:  [address] [place] [address]    Phone number:  [phone] (home)     Additional phone number:   Household Members/Support Persons (HM/SP):   Household Member/Support Person 1, Household Member/Support Person 2, Household Member/Support Person 3   HM/SP Name Relationship DOB or Age  HM/SP -[place] FOB 11/03/1985  HM/SP -[place] Son 03/13/2008  HM/SP -3 Nevaeh [place] Daughter 05/22/2015  HM/SP -4        HM/SP -5        HM/SP -6        HM/SP -7        HM/SP -8          Natural Supports (not living in the home):  Immediate Family   Professional Supports: None   Employment: Full-time   Type of Work: [employer]   Education:  Public librarian arranged:    Museum/gallery curator Resources:  Multimedia programmer    Other Resources:  Physicist, medical  , [place] Considerations Which May Impact Care:  None identified  Strengths:  Ability to meet basic needs  , Home prepared for child  , Pediatrician chosen   Psychotropic Medications:         Pediatrician:    [facility] area  Pediatrician List:   [facility]  [place]      Pediatrician Fax Number:    Risk Factors/Current Problems:  Substance Use     Cognitive State:  Linear Thinking  , Able to Concentrate  , Alert  , Goal Oriented     Mood/Affect:  Calm  , Comfortable  , Relaxed   , Interested     CSW Assessment: CSW was consulted due to marijuana use during pregnancy. CSW met with MOB at bedside to complete assessment. When CSW entered room, MOB was observed sitting in chair, holding and bonding with infant "Nasir." CSW introduced self and explained reason for consult. MOB presented as calm, was agreeable to consult and remained engaged throughout encounter.   CSW collected demographic information and inquired about mental health history. MOB denies a history of mental health diagnoses/symptoms and denies a history of postpartum depression after having her 2 older children. MOB states she has not taken psychotropic medication and has not attended therapy in the past. MOB identified her father and her sister as supports. MOB denied current SI/HI/DV.  CSW provided education regarding the baby blues period vs. perinatal mood disorders, discussed treatment and gave resources for mental health follow up if concerns arise.  CSW recommends self-evaluation during the postpartum time period using the New Mom Checklist from Postpartum Progress and encouraged MOB to contact a medical professional if symptoms are noted at any time.    CSW provided review of Sudden Infant Death Syndrome (SIDS) precautions.    MOB reports she has all needed items for infant, including  a car seat and bassinet. MOB has chosen [facility] for infant's follow up care, but expressed interest in a list of pediatricians, which CSW provided.  CSW informed MOB about hospital drug screen policy due to documented use of marijuana during pregnancy. CSW explained that the hospital policy entails taking a urine drug screen and CDS on infant. CSW explained that because a urine sample was not taken, the umbilical cord drug screen would be monitored and a CPS report would be made if warranted.   CSW was informed prior to consult by infant's RN that MOB did not want a urine drug screen collected on infant and  refused for a cotton ball to be placed in infant's diaper. At the time of CSW consult, infant was 38 hours old and a UDS would not be accurate due to the amount infant has voided since birth. CSW inquired about MOB's documented refusal. MOB stated that infant's Nurse Practitioner reviewed the hospital drug screen policy with MOB and added that when the nurse went to place the cotton ball in infant's diaper, MOB was under the impression that the urine drug screen was optional and not required per hospital policy. CSW apologized for any potential miscommunication and reiterated the hospital policy. CSW inquired about substance use during pregnancy. 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 MOB states her last use of marijuana was the beginning of January, 2023. MOB denied using other illicit substances during pregnancy. CSW inquired about prior CPS involvement. MOB states that there were 2 CPS cases opened in the past, which are now closed, regarding the care of her son's biological father towards her son, when her son was Kindergarten age (about 9 years ago.) MOB states both cases were reports made by her son's school, stating the first report involved concern that her son was being given Adderall and a second report entailed concern regarding her son's father causing physical harm to her son's genitals. MOB reports both claims were falsely made and explained that in the first case, her son was given legal supplements to help him manage hyperactivity and in the second case, her son experienced pain in his genital area while at school due to her son having medical complications post circumcision. MOB explained her son was not able to articulate what was happening to his teachers at the time due to his young age. CSW explained that a CPS report would be made due to MOB's refusal to perform a urine drug screen on infant while inpatient and infant's CDS would continue to be monitored. CSW  explained that updates will be provided to [facility] if any barriers to infant's discharge result. MOB expressed understanding.  CSW placed call to [facility] CPS line and made CPS report to on call social worker, Leeroy Bock. Per Leeroy Bock, the report will be screened out and there are no barriers to infant's discharge. Infant's  NP and RN provided update.  CSW identifies no further need for intervention and no barriers to discharge at this time.  CSW Plan/Description:  Sudden Infant Death Syndrome (SIDS) Education, Perinatal Mood and Anxiety Disorder (PMADs) Education, Hospital Drug Screen Policy Information, Child Protective Service Report  , CSW Will Continue to Monitor Umbilical Cord Tissue Drug Screen Results and Make Report if Warranted, No Further Intervention Required/No Barriers to Discharge    08/17/2022, 3:28 PM

## 2022-08-19 ENCOUNTER — Encounter: Payer: BC Managed Care – PPO | Admitting: Obstetrics & Gynecology

## 2022-08-20 ENCOUNTER — Encounter: Payer: BC Managed Care – PPO | Admitting: Obstetrics and Gynecology

## 2022-08-27 ENCOUNTER — Telehealth: Payer: Self-pay

## 2022-08-27 ENCOUNTER — Ambulatory Visit: Payer: BC Managed Care – PPO | Admitting: Gastroenterology

## 2022-08-27 ENCOUNTER — Encounter: Payer: BC Managed Care – PPO | Admitting: Advanced Practice Midwife

## 2022-08-27 ENCOUNTER — Telehealth (HOSPITAL_COMMUNITY): Payer: Self-pay

## 2022-08-27 NOTE — Telephone Encounter (Signed)
Patient reports that she is doing ok. "I've been having some elevated BP's. Someone from my doctor's office called and I spoke to them about my BP. They said they are going to get me an appointment." RN reviewed preeclampsia and the warning signs. RN encouraged patient to reach back out to her OB if she does not hear back. RN also told that she can come to MAU and we are happy to help. Patient declines any other questions/concerns about her health and healing.  Patient reports that baby is doing well. Eating and gaining weight well. "I'm working on latching and having nipple pain." RN reviewed outpatient [place] appointment information with patient. "Baby sleeps with me, he won't stay in his bassinet." RN reviewed ABC's of safe sleep with patient. Patient verbalizes understanding. Patient declines any other questions or concerns about baby.  EPDS score is 0.  [facility]   08/27/22,1539

## 2022-08-27 NOTE — Telephone Encounter (Signed)
Patient spoke with [place] and reported multiple elevated BP and headache following hospital discharge. BP today is 120/85 and patient denies any symptoms today.

## 2022-08-28 ENCOUNTER — Other Ambulatory Visit: Payer: Self-pay

## 2022-08-28 ENCOUNTER — Ambulatory Visit (INDEPENDENT_AMBULATORY_CARE_PROVIDER_SITE_OTHER): Payer: BC Managed Care – PPO

## 2022-08-28 VITALS — BP 119/69 | HR 82 | Wt 170.4 lb

## 2022-08-28 DIAGNOSIS — Z0130 Encounter for examination of blood pressure without abnormal findings: Secondary | ICD-10-CM

## 2022-08-28 NOTE — Progress Notes (Signed)
Blood Pressure Check Visit  Karina Torres is here for blood pressure check following vacuum assisted vaginal delivery on 08/16/22. History of HELLP. BP normal in hospital, but a few elevated BP at home.  Home BP values:  08/21/22: 142/89   Babyscripts:   Describes some lightheaded/dizzy feelings since delivery usually lasting approx 30 seconds. Describes intermittent headaches. BP normal today; denies symptoms. Will continue to check daily and log into Babyscripts. Reviewed headache prevention with Mag glycinate and B12 supplement.  Reports nipple pain associated with breastfeeding. Was evaluated by [facility] and found to have possible tongue tie. Ernestina Patches, MD to bedside for eval. See note in baby's chart.   Annabell Howells, RN 08/28/2022  8:53 AM

## 2022-08-28 NOTE — Patient Instructions (Signed)
Headache treatment: Excedrin Tension Headache Relief         -or- Extra Strength Tylenol   Headache prevention: Vitamin B12 Magnesium glycinate

## 2022-09-15 ENCOUNTER — Telehealth: Payer: BC Managed Care – PPO | Admitting: Family

## 2022-09-15 DIAGNOSIS — B9689 Other specified bacterial agents as the cause of diseases classified elsewhere: Secondary | ICD-10-CM

## 2022-09-15 DIAGNOSIS — N760 Acute vaginitis: Secondary | ICD-10-CM | POA: Diagnosis not present

## 2022-09-15 MED ORDER — METRONIDAZOLE 500 MG PO TABS: 500.0000 mg | ORAL_TABLET | Freq: Two times a day (BID) | ORAL | 0 refills | Status: DC

## 2022-09-15 NOTE — Progress Notes (Signed)
E-Visit for Vaginal Symptoms ? ?We are sorry that you are not feeling well. Here is how we plan to help! ?Based on what you shared with me it looks like you: May have a vaginosis due to bacteria ? ?Vaginosis is an inflammation of the vagina that can result in discharge, itching and pain. The cause is usually a change in the normal balance of vaginal bacteria or an infection. Vaginosis can also result from reduced estrogen levels after menopause. ? ?The most common causes of vaginosis are: ? ? Bacterial vaginosis which results from an overgrowth of one on several organisms that are normally present in your vagina. ? ? Yeast infections which are caused by a naturally occurring fungus called candida. ? ? Vaginal atrophy (atrophic vaginosis) which results from the thinning of the vagina from reduced estrogen levels after menopause. ? ? Trichomoniasis which is caused by a parasite and is commonly transmitted by sexual intercourse. ? ?Factors that increase your risk of developing vaginosis include: ?Medications, such as antibiotics and steroids ?Uncontrolled diabetes ?Use of hygiene products such as bubble bath, vaginal spray or vaginal deodorant ?Douching ?Wearing damp or tight-fitting clothing ?Using an intrauterine device (IUD) for birth control ?Hormonal changes, such as those associated with pregnancy, birth control pills or menopause ?Sexual activity ?Having a sexually transmitted infection ? ?Your treatment plan is Metronidazole or Flagyl 500mg twice a day for 7 days.  I have electronically sent this prescription into the pharmacy that you have chosen. ? ?Be sure to take all of the medication as directed. Stop taking any medication if you develop a rash, tongue swelling or shortness of breath. Mothers who are breast feeding should consider pumping and discarding their breast milk while on these antibiotics. However, there is no consensus that infant exposure at these doses would be harmful.  ?Remember that  medication creams can weaken latex condoms. ?. ? ? ?HOME CARE: ? ?Good hygiene may prevent some types of vaginosis from recurring and may relieve some symptoms: ? ?Avoid baths, hot tubs and whirlpool spas. Rinse soap from your outer genital area after a shower, and dry the area well to prevent irritation. Don't use scented or harsh soaps, such as those with deodorant or antibacterial action. ?Avoid irritants. These include scented tampons and pads. ?Wipe from front to back after using the toilet. Doing so avoids spreading fecal bacteria to your vagina. ? ?Other things that may help prevent vaginosis include: ? ?Don't douche. Your vagina doesn't require cleansing other than normal bathing. Repetitive douching disrupts the normal organisms that reside in the vagina and can actually increase your risk of vaginal infection. Douching won't clear up a vaginal infection. ?Use a latex condom. Both female and female latex condoms may help you avoid infections spread by sexual contact. ?Wear cotton underwear. Also wear pantyhose with a cotton crotch. If you feel comfortable without it, skip wearing underwear to bed. Yeast thrives in moist environments ?Your symptoms should improve in the next day or two. ? ?GET HELP RIGHT AWAY IF: ? ?You have pain in your lower abdomen ( pelvic area or over your ovaries) ?You develop nausea or vomiting ?You develop a fever ?Your discharge changes or worsens ?You have persistent pain with intercourse ?You develop shortness of breath, a rapid pulse, or you faint. ? ?These symptoms could be signs of problems or infections that need to be evaluated by a medical provider now. ? ?MAKE SURE YOU  ? ?Understand these instructions. ?Will watch your condition. ?Will get help right   away if you are not doing well or get worse. ? ?Thank you for choosing an e-visit. ? ?Your e-visit answers were reviewed by a board certified advanced clinical practitioner to complete your personal care plan. Depending upon the  condition, your plan could have included both over the counter or prescription medications. ? ?Please review your pharmacy choice. Make sure the pharmacy is open so you can pick up prescription now. If there is a problem, you may contact your provider through MyChart messaging and have the prescription routed to another pharmacy.  Your safety is important to us. If you have drug allergies check your prescription carefully.  ? ?For the next 24 hours you can use MyChart to ask questions about today's visit, request a non-urgent call back, or ask for a work or school excuse. ?You will get an email in the next two days asking about your experience. I hope that your e-visit has been valuable and will speed your recovery. ? ?Approximately 5 minutes was spent documenting and reviewing patient's chart. ? ?

## 2022-09-17 ENCOUNTER — Encounter: Payer: Self-pay | Admitting: Family Medicine

## 2022-09-17 ENCOUNTER — Ambulatory Visit (INDEPENDENT_AMBULATORY_CARE_PROVIDER_SITE_OTHER): Payer: BC Managed Care – PPO | Admitting: Family Medicine

## 2022-09-17 ENCOUNTER — Other Ambulatory Visit: Payer: Self-pay

## 2022-09-17 VITALS — BP 116/79 | HR 89 | Ht 61.0 in | Wt 170.0 lb

## 2022-09-17 DIAGNOSIS — Z975 Presence of (intrauterine) contraceptive device: Secondary | ICD-10-CM

## 2022-09-17 DIAGNOSIS — Z30017 Encounter for initial prescription of implantable subdermal contraceptive: Secondary | ICD-10-CM

## 2022-09-17 DIAGNOSIS — Z3040 Encounter for surveillance of contraceptives, unspecified: Secondary | ICD-10-CM

## 2022-09-17 MED ORDER — ETONOGESTREL 68 MG ~~LOC~~ IMPL
68.0000 mg | DRUG_IMPLANT | Freq: Once | SUBCUTANEOUS | Status: AC
  Administered 2022-09-17: 68 mg via SUBCUTANEOUS

## 2022-09-17 NOTE — Progress Notes (Signed)
[facility] Partum Visit Note  Karina Torres is a 37 y.o. [phone] female who presents for a postpartum visit. She is 4 week postpartum following a normal spontaneous vaginal delivery.  I have fully reviewed the prenatal and intrapartum course. The delivery was at [redacted]w[redacted]d gestational weeks.  Anesthesia: epidural. Postpartum course has been uncomplicated. Baby is doing well. Baby had trouble pooping.  He is doing better now. Baby is feeding by breast. Bleeding staining only. Bowel function is normal. Bladder function is normal. Patient is not sexually active. Contraception method is abstinence. Postpartum depression screening: negative.   The pregnancy intention screening data noted above was reviewed. Potential methods of contraception were discussed. The patient elected to proceed with No data recorded.   Edinburgh Postnatal Depression Scale - 09/17/22 1050       Edinburgh Postnatal Depression Scale:  In the Past 7 Days   I have been able to laugh and see the funny side of things. 3    I have looked forward with enjoyment to things. 0    I have blamed myself unnecessarily when things went wrong. 1    I have been anxious or worried for no good reason. 0    I have felt scared or panicky for no good reason. 0    Things have been getting on top of me. 0    I have been so unhappy that I have had difficulty sleeping. 0    I have felt sad or miserable. 0    I have been so unhappy that I have been crying. 0    The thought of harming myself has occurred to me. 0    Edinburgh Postnatal Depression Scale Total 4             Health Maintenance Due  Topic Date Due   COVID-19 Vaccine (1) Never done    The following portions of the patient's history were reviewed and updated as appropriate: allergies, current medications, past family history, past medical history, past social history, past surgical history, and problem list.  Review of Systems Pertinent items are noted in HPI.  Objective:  BP  116/79   Pulse 89   Ht 5\' 1"  (1.549 m)   Wt 170 lb (77.1 kg)   LMP 11/15/2021 (Exact Date)   Breastfeeding Yes   BMI 32.12 kg/m    General:  alert, cooperative, and appears stated age   Breasts:  normal  Lungs: clear to auscultation bilaterally  Heart:  regular rate and rhythm, S1, S2 normal, no murmur, click, rub or gallop  Abdomen: soft, non-tender; bowel sounds normal; no masses,  no organomegaly   GU exam:  not indicated       Assessment:    There are no diagnoses linked to this encounter.  Normal  postpartum exam.   Plan:   Essential components of care per ACOG recommendations:  1.  Mood and well being: Patient with negative depression screening today. Reviewed local resources for support.  - Patient tobacco use? No.   - hx of drug use? No.     Edinburgh Postnatal Depression Scale - 09/17/22 1050       Edinburgh Postnatal Depression Scale:  In the Past 7 Days   I have been able to laugh and see the funny side of things. 3    I have looked forward with enjoyment to things. 0    I have blamed myself unnecessarily when things went wrong. 1    I have  been anxious or worried for no good reason. 0    I have felt scared or panicky for no good reason. 0    Things have been getting on top of me. 0    I have been so unhappy that I have had difficulty sleeping. 0    I have felt sad or miserable. 0    I have been so unhappy that I have been crying. 0    The thought of harming myself has occurred to me. 0    Edinburgh Postnatal Depression Scale Total 4             2. Infant care and feeding:  -Patient currently breastmilk feeding? Yes. Discussed returning to work and pumping. Reviewed importance of draining breast regularly to support lactation. Patient needs a work note. Patient was provided letter for work to allow for every 2-3 hr pumping breaks, and to be granted a private location to express breastmilk and refrigerated area to store breastmilk.  -Social determinants  of health (SDOH) reviewed in EPIC. No concerns  3. Sexuality, contraception and birth spacing - Patient does not want a pregnancy in the next year.  Desired family size is 3 children.  - Reviewed reproductive life planning. Reviewed contraceptive methods based on pt preferences and effectiveness.  Patient desired Nexplanon today.   - Discussed birth spacing of 18 months  Nexplanon Insertion Procedure Patient identified, informed consent performed, consent signed.   Patient does understand that irregular bleeding is a very common side effect of this medication. She was advised to have backup contraception for one week after placement. Pregnancy test in clinic today was negative.  Appropriate time out taken.  Patient's left arm was prepped and draped in the usual sterile fashion. The ruler used to measure and mark insertion area.  Patient was prepped with alcohol swab and then injected with 3 ml of 1% lidocaine.  She was prepped with betadine, Nexplanon removed from packaging,  Device confirmed in needle, then inserted full length of needle and withdrawn per handbook instructions. Nexplanon was able to palpated in the patient's arm; patient palpated the insert herself. There was minimal blood loss.  Patient insertion site covered with guaze and a pressure bandage to reduce any bruising.  The patient tolerated the procedure well and was given post procedure instructions.   LOT [id number] EXP 04-2024  4. Sleep and fatigue -Encouraged family/partner/community support of 4 hrs of uninterrupted sleep to help with mood and fatigue  5. Physical Recovery  - Discussed patients delivery and complications. She describes her labor as good. - Patient had a  vacuum assisted vaginal delivery . Patient had a 1st degree laceration. Perineal healing reviewed. Patient expressed understanding - Patient has urinary incontinence? No. - Patient is safe to resume physical and sexual activity  6.  Health Maintenance - HM  due items addressed Yes - Last pap smear  Diagnosis  Date Value Ref Range Status  02/05/2022   Final   - Negative for intraepithelial lesion or malignancy (NILM)   Pap smear not done at today's visit.  -Breast Cancer screening indicated? No.   7. Chronic Disease/Pregnancy Condition follow up: None  - PCP follow up  [facility], [place] [facility] [place], [place]

## 2022-09-21 ENCOUNTER — Telehealth (HOSPITAL_COMMUNITY): Payer: Self-pay

## 2022-09-21 NOTE — Telephone Encounter (Signed)
Chart review.

## 2023-02-13 ENCOUNTER — Ambulatory Visit
Admission: RE | Admit: 2023-02-13 | Discharge: 2023-02-13 | Disposition: A | Discharge status: Home / Self Care | Payer: Worker's Compensation | Source: Ambulatory Visit | Attending: Physician Assistant | Admitting: Physician Assistant

## 2023-02-13 ENCOUNTER — Other Ambulatory Visit: Payer: Self-pay | Admitting: Physician Assistant

## 2023-02-13 DIAGNOSIS — M25572 Pain in left ankle and joints of left foot: Secondary | ICD-10-CM | POA: Diagnosis present

## 2023-02-13 DIAGNOSIS — M25472 Effusion, left ankle: Secondary | ICD-10-CM | POA: Diagnosis present

## 2023-04-23 ENCOUNTER — Telehealth: Payer: BC Managed Care – PPO | Admitting: Family Medicine

## 2023-04-23 DIAGNOSIS — M549 Dorsalgia, unspecified: Secondary | ICD-10-CM

## 2023-04-23 NOTE — Progress Notes (Signed)
Because you are having pain with bending your head forward and down, and radiation of pain into several areas, we feel your condition warrants further evaluation and I recommend that you be seen in a face to face visit.   NOTE: There will be NO CHARGE for this eVisit   If you are having a true medical emergency please call 911.

## 2023-05-11 ENCOUNTER — Ambulatory Visit
Admission: RE | Admit: 2023-05-11 | Discharge: 2023-05-11 | Disposition: A | Discharge status: Home / Self Care | Payer: BC Managed Care – PPO | Source: Ambulatory Visit | Attending: Internal Medicine | Admitting: Internal Medicine

## 2023-05-11 VITALS — BP 109/73 | HR 74 | Temp 98.8°F | Resp 16

## 2023-05-11 DIAGNOSIS — R300 Dysuria: Secondary | ICD-10-CM | POA: Insufficient documentation

## 2023-05-11 DIAGNOSIS — M545 Low back pain, unspecified: Secondary | ICD-10-CM | POA: Diagnosis present

## 2023-05-11 LAB — POCT URINALYSIS DIP (MANUAL ENTRY)
Blood, UA: NEGATIVE
Glucose, UA: NEGATIVE mg/dL
Leukocytes, UA: NEGATIVE
Nitrite, UA: NEGATIVE
Spec Grav, UA: >=1.03 — AB (ref 1.010–1.025)
Urobilinogen, UA: 4 U/dL — AB
pH, UA: 7 (ref 5.0–8.0)

## 2023-05-11 LAB — POCT URINE PREGNANCY: Preg Test, Ur: NEGATIVE

## 2023-05-11 MED ORDER — NAPROXEN 500 MG PO TABS: 500.0000 mg | ORAL_TABLET | Freq: Two times a day (BID) | ORAL | 0 refills | Status: DC | PRN

## 2023-05-11 MED ORDER — KETOROLAC TROMETHAMINE 30 MG/ML IJ SOLN
30.0000 mg | Freq: Once | INTRAMUSCULAR | Status: AC
  Administered 2023-05-11: 30 mg via INTRAMUSCULAR

## 2023-05-11 MED ORDER — CYCLOBENZAPRINE HCL 10 MG PO TABS: 10.0000 mg | ORAL_TABLET | Freq: Two times a day (BID) | ORAL | 0 refills | Status: DC | PRN

## 2023-05-11 NOTE — Discharge Instructions (Addendum)
You were given a Toradol injection in clinic today. Do not take any over the counter NSAID's such as Advil, ibuprofen, Aleve, or naproxen for 24 hours. You may take tylenol if needed.  You may start Flexeril today.  Please note this medication can make you drowsy.  Do not drink alcohol or drive on this medication.  May start prescription naproxen tomorrow, 11/18.  Heat and rest to the back.  Please follow-up with your PCP if your symptoms do not improve.  Please go to the ER for any worsening symptoms.  I hope you feel better soon!

## 2023-05-11 NOTE — ED Triage Notes (Signed)
Pt presents to UC w/ c/o right sided lower back pain x3 weeks. Pt reports pain radiates to the bottom of her right buttock at times and sitting makes it worse. Denies falling or direct injury. Reports she works at [employer] and lifts heavy boxes at times. She also reports the pain radiates to the right side of her abdomen and has had a few episodes of dysuria. Homes remedies: motrin and tylenol

## 2023-05-11 NOTE — ED Provider Notes (Signed)
[facility]    CSN: [id number] Arrival date & time: 05/11/23  1252      History   Chief Complaint Chief Complaint  Patient presents with   Back Pain    Entered by patient    HPI Karina Torres is a 37 y.o. female presents for back pain.  Patient reports 3 weeks of an intermittent right lower back pain that occasionally radiates into her upper buttock and sometimes around to her side.  States it only occurs if she has been sitting for too long.  Pain is an aching type pain.  No numbness/tingling/weakness of her lower extremities, no bowel or bladder incontinence, no saddle paresthesia.  Does report a couple episodes of dysuria but denies any frequency, urgency, hematuria, fevers, nausea/vomiting, flank pain.  No vaginal discharge or STD to concern.  Denies any injury or known inciting event but does states she works for [employer] and does a lot of lifting.  Reports remote history of back pain in the past but denies back surgeries.  She has been taking over-the-counter Tylenol/ibuprofen with minimal improvement, last dose was yesterday.  No other concerns at this time.   Back Pain Associated symptoms: dysuria     Past Medical History:  Diagnosis Date   Hx of chlamydia infection    Hx of HELLP syndrome, currently pregnant    Hypertension     Patient Active Problem List   Diagnosis Date Noted   Vaginal birth after cesarean delivery 08/16/2022   Indication for care in labor and delivery, antepartum 08/15/2022   Preterm uterine contractions 06/27/2022   Fetal echogenic intracardiac focus on prenatal ultrasound 04/08/2022   SMA carrier 02/15/2022   AMA (advanced maternal age) multigravida 35+ 02/05/2022   History of preterm delivery, currently pregnant in first trimester 02/05/2022   Supervision of high risk pregnancy, antepartum 01/29/2022   History of HELLP syndrome, currently pregnant 12/18/2021   Status post vacuum-assisted vaginal delivery 12/18/2021    Past  Surgical History:  Procedure Laterality Date   CESAREAN SECTION     COLPOSCOPY     WISDOM TOOTH EXTRACTION Bilateral     OB History     Gravida  3   Para  3   Term  2   Preterm  1   AB      Living  3      SAB      IAB      Ectopic      Multiple  0   Live Births  3            Home Medications    Prior to Admission medications   Medication Sig Start Date End Date Taking? Authorizing Provider  cyclobenzaprine (FLEXERIL) 10 MG tablet Take 1 tablet (10 mg total) by mouth 2 (two) times daily as needed for muscle spasms. 05/11/23  Yes Radford Pax, NP  naproxen (NAPROSYN) 500 MG tablet Take 1 tablet (500 mg total) by mouth 2 (two) times daily as needed (back pain). 05/11/23  Yes Radford Pax, NP  Acetaminophen-Caffeine [facility] TENSION HEADACHE PO) Take by mouth.    [provider]  etonogestrel (NEXPLANON) 68 MG IMPL implant 1 each by Subdermal route once. 09/17/22   [provider]  metroNIDAZOLE (FLAGYL) 500 MG tablet Take 1 tablet (500 mg total) by mouth 2 (two) times daily. 09/15/22   Junie Spencer, FNP  Prenatal Vit-Fe Fumarate-FA (PRENATAL PO) Take by mouth.    [provider]  Family History Family History  Problem Relation Age of Onset   Asthma Mother    Diabetes Mother    Learning disabilities Son    Diabetes Maternal Grandmother    Diabetes Maternal Grandfather    Diabetes Father    Alcohol abuse Neg Hx    Arthritis Neg Hx    Birth defects Neg Hx    Cancer Neg Hx    COPD Neg Hx    Depression Neg Hx    Drug abuse Neg Hx    Early death Neg Hx    Hearing loss Neg Hx    Heart disease Neg Hx    Hyperlipidemia Neg Hx    Hypertension Neg Hx    Kidney disease Neg Hx    Mental illness Neg Hx    Mental retardation Neg Hx    Miscarriages / Stillbirths Neg Hx    Stroke Neg Hx    Vision loss Neg Hx    Varicose Veins Neg Hx     Social History Social History   Tobacco Use   Smoking status: Never   Smokeless  tobacco: Never  Vaping Use   Vaping status: Former  Substance Use Topics   Alcohol use: Not Currently    Comment: occasional use- not during pregnancy   Drug use: Not Currently    Types: Marijuana    Comment: every day user "1 or 2 a day"     Allergies   Patient has no known allergies.   Review of Systems Review of Systems  Genitourinary:  Positive for dysuria.  Musculoskeletal:  Positive for back pain.     Physical Exam Triage Vital Signs ED Triage Vitals  Encounter Vitals Group     BP 05/11/23 1302 109/73     Systolic BP Percentile --      Diastolic BP Percentile --      Pulse Rate 05/11/23 1302 74     Resp 05/11/23 1302 16     Temp 05/11/23 1302 98.8 F (37.1 C)     Temp Source 05/11/23 1302 Oral     SpO2 05/11/23 1302 96 %     Weight --      Height --      Head Circumference --      Peak Flow --      Pain Score 05/11/23 1300 6     Pain Loc --      Pain Education --      Exclude from Growth Chart --    No data found.  Updated Vital Signs BP 109/73 (BP Location: Right Arm)   Pulse 74   Temp 98.8 F (37.1 C) (Oral)   Resp 16   SpO2 96%   Visual Acuity Right Eye Distance:   Left Eye Distance:   Bilateral Distance:    Right Eye Near:   Left Eye Near:    Bilateral Near:     Physical Exam Vitals and nursing note reviewed.  Constitutional:      General: She is not in acute distress.    Appearance: Normal appearance. She is not ill-appearing.  HENT:     Head: Normocephalic and atraumatic.  Eyes:     Pupils: Pupils are equal, round, and reactive to light.  Cardiovascular:     Rate and Rhythm: Normal rate.  Pulmonary:     Effort: Pulmonary effort is normal.  Abdominal:     General: Bowel sounds are normal.     Palpations: Abdomen is soft. There is no hepatomegaly  or splenomegaly.     Tenderness: There is no abdominal tenderness. There is no right CVA tenderness or left CVA tenderness. Negative signs include Rovsing's sign and McBurney's sign.   Musculoskeletal:     Lumbar back: Tenderness present. No swelling, edema, deformity, signs of trauma, lacerations, spasms or bony tenderness. Normal range of motion. Negative right straight leg raise test and negative left straight leg raise test. No scoliosis.       Back:     Comments: Strength 5 out of 5 bilateral lower extremities  Skin:    General: Skin is warm and dry.  Neurological:     General: No focal deficit present.     Mental Status: She is alert and oriented to person, place, and time.     Deep Tendon Reflexes:     Reflex Scores:      Patellar reflexes are 2+ on the right side and 2+ on the left side. Psychiatric:        Mood and Affect: Mood normal.        Behavior: Behavior normal.      UC Treatments / Results  Labs (all labs ordered are listed, but only abnormal results are displayed) Labs Reviewed  POCT URINALYSIS DIP (MANUAL ENTRY) - Abnormal; Notable for the following components:      Result Value   Color, UA straw (*)    Bilirubin, UA small (*)    Ketones, POC UA small (15) (*)    Spec Grav, UA >=1.030 (*)    Protein Ur, POC trace (*)    Urobilinogen, UA 4.0 (*)    All other components within normal limits  URINE CULTURE  POCT URINE PREGNANCY    EKG   Radiology No results found.  Procedures Procedures (including critical care time)  Medications Ordered in UC Medications  ketorolac (TORADOL) 30 MG/ML injection 30 mg (has no administration in time range)    Initial Impression / Assessment and Plan / UC Course  I have reviewed the triage vital signs and the nursing notes.  Pertinent labs & imaging results that were available during my care of the patient were reviewed by me and considered in my medical decision making (see chart for details).      Reviewed exam and symptoms with patient.  No red flags.  Urine negative for UTI will culture given symptoms.  Patient given Toradol injection in clinic.  She was monitored for 10 minutes after  injection with and tolerated well with no reaction noted.  Instructed no NSAIDs for 24 hours and verbalized understanding.  Trial of Flexeril, side effect profile reviewed.  Start naproxen tomorrow, 11/18.  Discussed heat and rest.  PCP follow-up as symptoms do not improve.  ER precautions reviewed. Final Clinical Impressions(s) / UC Diagnoses   Final diagnoses:  Acute right-sided low back pain without sciatica  Dysuria     Discharge Instructions      You were given a Toradol injection in clinic today. Do not take any over the counter NSAID's such as Advil, ibuprofen, Aleve, or naproxen for 24 hours. You may take tylenol if needed.  You may start Flexeril today.  Please note this medication can make you drowsy.  Do not drink alcohol or drive on this medication.  May start prescription naproxen tomorrow, 11/18.  Heat and rest to the back.  Please follow-up with your PCP if your symptoms do not improve.  Please go to the ER for any worsening symptoms.  I hope you feel  better soon!      ED Prescriptions     Medication Sig Dispense Auth. Provider   cyclobenzaprine (FLEXERIL) 10 MG tablet Take 1 tablet (10 mg total) by mouth 2 (two) times daily as needed for muscle spasms. 10 tablet Radford Pax, NP   naproxen (NAPROSYN) 500 MG tablet Take 1 tablet (500 mg total) by mouth 2 (two) times daily as needed (back pain). 14 tablet Radford Pax, NP      PDMP not reviewed this encounter.   Radford Pax, NP 05/11/23 1326

## 2023-05-13 LAB — URINE CULTURE: Culture: NO GROWTH

## 2023-08-05 ENCOUNTER — Telehealth: Payer: BC Managed Care – PPO | Admitting: Physician Assistant

## 2023-08-05 DIAGNOSIS — B9789 Other viral agents as the cause of diseases classified elsewhere: Secondary | ICD-10-CM

## 2023-08-05 DIAGNOSIS — J019 Acute sinusitis, unspecified: Secondary | ICD-10-CM | POA: Diagnosis not present

## 2023-08-05 MED ORDER — IPRATROPIUM BROMIDE 0.03 % NA SOLN: 2.0000 | Freq: Two times a day (BID) | NASAL | 0 refills | Status: DC

## 2023-08-05 NOTE — Progress Notes (Signed)

## 2023-08-05 NOTE — Progress Notes (Signed)
I have spent 5 minutes in review of e-visit questionnaire, review and updating patient chart, medical decision making and response to patient.   Piedad Climes, PA-C

## 2024-03-29 ENCOUNTER — Telehealth: Admitting: Physician Assistant

## 2024-03-29 DIAGNOSIS — M5442 Lumbago with sciatica, left side: Secondary | ICD-10-CM | POA: Diagnosis not present

## 2024-03-29 MED ORDER — NAPROXEN 500 MG PO TABS: 500.0000 mg | ORAL_TABLET | Freq: Two times a day (BID) | ORAL | 0 refills | Status: AC

## 2024-03-29 MED ORDER — CYCLOBENZAPRINE HCL 10 MG PO TABS: 5.0000 mg | ORAL_TABLET | Freq: Three times a day (TID) | ORAL | 0 refills | Status: AC | PRN

## 2024-03-29 NOTE — Progress Notes (Signed)

## 2024-05-17 ENCOUNTER — Encounter

## 2024-07-11 ENCOUNTER — Encounter (HOSPITAL_BASED_OUTPATIENT_CLINIC_OR_DEPARTMENT_OTHER): Payer: Self-pay

## 2024-07-11 ENCOUNTER — Emergency Department (HOSPITAL_BASED_OUTPATIENT_CLINIC_OR_DEPARTMENT_OTHER)
Admission: EM | Admit: 2024-07-11 | Discharge: 2024-07-11 | Disposition: A | Discharge status: Home / Self Care | Attending: Emergency Medicine | Admitting: Emergency Medicine

## 2024-07-11 ENCOUNTER — Emergency Department (HOSPITAL_BASED_OUTPATIENT_CLINIC_OR_DEPARTMENT_OTHER): Admitting: Radiology

## 2024-07-11 ENCOUNTER — Other Ambulatory Visit: Payer: Self-pay

## 2024-07-11 DIAGNOSIS — S50312A Abrasion of left elbow, initial encounter: Secondary | ICD-10-CM | POA: Insufficient documentation

## 2024-07-11 DIAGNOSIS — S80211A Abrasion, right knee, initial encounter: Secondary | ICD-10-CM | POA: Diagnosis not present

## 2024-07-11 DIAGNOSIS — M25522 Pain in left elbow: Secondary | ICD-10-CM

## 2024-07-11 DIAGNOSIS — Y9301 Activity, walking, marching and hiking: Secondary | ICD-10-CM | POA: Diagnosis not present

## 2024-07-11 DIAGNOSIS — W010XXA Fall on same level from slipping, tripping and stumbling without subsequent striking against object, initial encounter: Secondary | ICD-10-CM | POA: Insufficient documentation

## 2024-07-11 DIAGNOSIS — M25561 Pain in right knee: Secondary | ICD-10-CM

## 2024-07-11 NOTE — ED Provider Notes (Addendum)
 " [place] [facility] AT [facility] Provider Note   CSN: [id number] Arrival date & time: 07/11/24  1715     Patient presents with: Karina Torres is a 39 y.o. female patient who presents to the emergency department today for further evaluation of a mechanical trip and fall.  Patient was walking back to her car when she tripped over a tree root.  She fell forward onto her right knee and left elbow.  Did not hit her head or lose consciousness.    Fall       Prior to Admission medications  Medication Sig Start Date End Date Taking? Authorizing Provider  Acetaminophen -Caffeine  (EXCEDRIN  TENSION HEADACHE PO) Take by mouth.    [provider]  cyclobenzaprine  (FLEXERIL ) 10 MG tablet Take 0.5-1 tablets (5-10 mg total) by mouth 3 (three) times daily as needed. 03/29/24   Vivienne Delon HERO, PA-C  etonogestrel  (NEXPLANON ) 68 MG IMPL implant 1 each by Subdermal route once. 09/17/22   [provider]  naproxen  (NAPROSYN ) 500 MG tablet Take 1 tablet (500 mg total) by mouth 2 (two) times daily with a meal. 03/29/24   Vivienne Delon HERO, PA-C    Allergies: Patient has no known allergies.    Review of Systems  All other systems reviewed and are negative.   Updated Vital Signs BP 114/79   Pulse 89   Temp 98.5 F (36.9 C)   Resp 15   Ht 5' 1 (1.549 m)   Wt 70.3 kg   SpO2 100%   BMI 29.29 kg/m   Physical Exam Vitals and nursing note reviewed.  Constitutional:      Appearance: Normal appearance.  HENT:     Head: Normocephalic and atraumatic.  Eyes:     General:        Right eye: No discharge.        Left eye: No discharge.     Conjunctiva/sclera: Conjunctivae normal.  Pulmonary:     Effort: Pulmonary effort is normal.  Musculoskeletal:     Comments: Abrasion to the right knee with mild swelling.  There is also an abrasion to the left elbow.  She has full range of motion in both. Sensation in tact.  Negative anterior drawer  test.  Negative valgus and varus stress testing.  Skin:    General: Skin is warm and dry.     Findings: No rash.  Neurological:     General: No focal deficit present.     Mental Status: She is alert.  Psychiatric:        Mood and Affect: Mood normal.        Behavior: Behavior normal.     (all labs ordered are listed, but only abnormal results are displayed) Labs Reviewed - No data to display  EKG: None  Radiology: DG Knee 3 Views Right Result Date: 07/11/2024 EXAM: 2 VIEW(S) XRAY OF THE KNEE 07/11/2024 05:53:12 PM COMPARISON: None available. CLINICAL HISTORY: 809823 Fall 190176 fall fall 190176 Fall 190176 FINDINGS: BONES AND JOINTS: No acute fracture. No malalignment. No significant joint effusion. No significant degenerative changes. SOFT TISSUES: Unremarkable. IMPRESSION: 1. No evidence of acute traumatic injury. Electronically signed by: Greig Pique MD 07/11/2024 06:12 PM EST RP Workstation: [id number]   DG Elbow Complete Left Result Date: 07/11/2024 EXAM: 3 VIEW(S) XRAY OF THE LEFT ELBOW COMPARISON: None available. CLINICAL HISTORY: fall fall fall FINDINGS: BONES AND JOINTS: No acute fracture. No malalignment. SOFT TISSUES: Birth control implant device noted in the  subcutaneous soft tissues of the upper arm with a fracture in the mid device. IMPRESSION: 1. No acute bony abnormality. Electronically signed by: Franky Crease MD 07/11/2024 06:08 PM EST RP Workstation: [id number]     Procedures   Medications Ordered in the ED - No data to display  Clinical Course as of 07/11/24 1830  Sun Jul 11, 2024  1823 DG Elbow Complete Left I personally ordered and interpreted this study.  This is negative.  No signs of fracture.  I do agree with radiologist interpretation. [CF]  1823 DG Knee 3 Views Right I personally ordered and interpreted the study.  This is normal.  I do agree with radiologist interpretation. [CF]    Clinical Course User Index [CF] Theotis Cameron HERO, PA-C   Type Medical Decision Making Karina Torres is a 39 y.o. female patient who presents to the emergency department today for further evaluation of a mechanical trip and fall.  Patient did not hit her head or lose consciousness.  Will plan to get imaging.  Patient took 1200 mg of ibuprofen  prior to arrival.  We discussed that this is too much Advil .  Imaging was normal.  Will plan to discharge home.  Likely just soft tissue injury.  Will have her follow-up with her primary care doctor.  I told her to hold off on Advil  for today and tomorrow.  She can take Tylenol  650 mg.  Strict turn precautions were discussed.  She is safe for discharge.   Amount and/or Complexity of Data Reviewed Radiology: ordered. Decision-making details documented in ED Course.     Final diagnoses:  Acute pain of right knee  Left elbow pain    ED Discharge Orders     None          Theotis Cameron HERO, [place] 07/11/24 1830    Theotis Cameron HERO, PA-C 07/11/24 1833    Bari Roxie HERO, DO 07/11/24 2356  "

## 2024-07-11 NOTE — ED Notes (Signed)
 Xray tech at bedside.

## 2024-07-11 NOTE — Discharge Instructions (Signed)
 You can take 650 mg of Tylenol .  I would hold off on Advil  for the rest of today and tomorrow.  Then you can take 600 mg of ibuprofen  every 6 hours.  Please follow-up with your primary care doctor.  You can return to the emergency department for any worsening symptoms.

## 2024-07-11 NOTE — ED Triage Notes (Signed)
 Pt EMS pt tripped and fell, landing on knee. Pt denies any LOC or headstrike. Pt c/o pain to R knee and L elbow. Pt reports taking 1200 mg Motrin .

## 2024-08-20 ENCOUNTER — Ambulatory Visit: Payer: Self-pay | Admitting: Certified Nurse Midwife
